# Patient Record
Sex: Female | Born: 1970 | Hispanic: No | Marital: Single | Smoking: Current every day smoker
Health system: Southern US, Community
[De-identification: ages and names within clinical notes are randomized; demographics above are authoritative.]

## PROBLEM LIST (undated history)

## (undated) DIAGNOSIS — B20 Human immunodeficiency virus [HIV] disease: Secondary | ICD-10-CM

## (undated) DIAGNOSIS — Z21 Asymptomatic human immunodeficiency virus [HIV] infection status: Secondary | ICD-10-CM

---

## 2006-02-07 HISTORY — PX: ABDOMINAL HYSTERECTOMY: SHX81

## 2013-02-20 LAB — HEPATITIS C RNA QUANTITATIVE: HEPATITIS C RNA-PCR: 70900

## 2013-04-03 LAB — HEPATIC FUNCTION PANEL
ALT: 15 U/L (ref 7–35)
AST: 19 U/L (ref 13–35)
Alkaline Phosphatase: 105 U/L (ref 25–125)
BILIRUBIN, TOTAL: 2.2 mg/dL

## 2013-04-03 LAB — BASIC METABOLIC PANEL
Creatinine: 0.8 mg/dL (ref 0.5–1.1)
Potassium: 3.7 mmol/L (ref 3.4–5.3)
SODIUM: 144 mmol/L (ref 137–147)

## 2013-04-03 LAB — T-HELPER CELLS (CD4) COUNT (NOT AT ARMC): ABSOLUTE CD4: 470

## 2013-04-03 LAB — HM PAP SMEAR: HM PAP: 2012

## 2013-04-03 LAB — CBC AND DIFFERENTIAL
Platelets: 312 10*3/uL (ref 150–399)
WBC: 8.6 10^3/mL

## 2013-04-03 LAB — HIV-1 RNA QUANT-NO REFLEX-BLD: HIV 1 RNA QUANT: 2040

## 2013-10-08 ENCOUNTER — Other Ambulatory Visit (HOSPITAL_COMMUNITY)
Admission: RE | Admit: 2013-10-08 | Discharge: 2013-10-08 | Disposition: A | Discharge status: Home / Self Care | Payer: Medicaid Other | Source: Ambulatory Visit | Attending: Internal Medicine | Admitting: Internal Medicine

## 2013-10-08 ENCOUNTER — Other Ambulatory Visit: Payer: Self-pay | Admitting: Internal Medicine

## 2013-10-08 ENCOUNTER — Ambulatory Visit: Payer: Medicaid Other

## 2013-10-08 VITALS — Wt 112.0 lb

## 2013-10-08 DIAGNOSIS — B1920 Unspecified viral hepatitis C without hepatic coma: Secondary | ICD-10-CM

## 2013-10-08 DIAGNOSIS — F17200 Nicotine dependence, unspecified, uncomplicated: Secondary | ICD-10-CM

## 2013-10-08 DIAGNOSIS — B20 Human immunodeficiency virus [HIV] disease: Secondary | ICD-10-CM

## 2013-10-08 DIAGNOSIS — Z8742 Personal history of other diseases of the female genital tract: Secondary | ICD-10-CM

## 2013-10-08 DIAGNOSIS — Z79899 Other long term (current) drug therapy: Secondary | ICD-10-CM

## 2013-10-08 DIAGNOSIS — K7460 Unspecified cirrhosis of liver: Secondary | ICD-10-CM

## 2013-10-08 DIAGNOSIS — Z8619 Personal history of other infectious and parasitic diseases: Secondary | ICD-10-CM

## 2013-10-08 DIAGNOSIS — Z113 Encounter for screening for infections with a predominantly sexual mode of transmission: Secondary | ICD-10-CM | POA: Insufficient documentation

## 2013-10-08 DIAGNOSIS — F1910 Other psychoactive substance abuse, uncomplicated: Secondary | ICD-10-CM

## 2013-10-08 DIAGNOSIS — A6000 Herpesviral infection of urogenital system, unspecified: Secondary | ICD-10-CM

## 2013-10-08 DIAGNOSIS — Z111 Encounter for screening for respiratory tuberculosis: Secondary | ICD-10-CM

## 2013-10-08 DIAGNOSIS — M549 Dorsalgia, unspecified: Secondary | ICD-10-CM

## 2013-10-08 LAB — CBC WITH DIFFERENTIAL/PLATELET
BASOS ABS: 0 10*3/uL (ref 0.0–0.1)
Basophils Relative: 0 % (ref 0–1)
Eosinophils Absolute: 0.1 10*3/uL (ref 0.0–0.7)
Eosinophils Relative: 1 % (ref 0–5)
HEMATOCRIT: 38 % (ref 36.0–46.0)
Hemoglobin: 12.5 g/dL (ref 12.0–15.0)
Lymphocytes Relative: 42 % (ref 12–46)
Lymphs Abs: 2.4 10*3/uL (ref 0.7–4.0)
MCH: 27.8 pg (ref 26.0–34.0)
MCHC: 32.9 g/dL (ref 30.0–36.0)
MCV: 84.4 fL (ref 78.0–100.0)
MONOS PCT: 13 % — AB (ref 3–12)
Monocytes Absolute: 0.7 10*3/uL (ref 0.1–1.0)
Neutro Abs: 2.5 10*3/uL (ref 1.7–7.7)
Neutrophils Relative %: 44 % (ref 43–77)
Platelets: 341 10*3/uL (ref 150–400)
RBC: 4.5 MIL/uL (ref 3.87–5.11)
RDW: 14.7 % (ref 11.5–15.5)
WBC: 5.6 10*3/uL (ref 4.0–10.5)

## 2013-10-09 LAB — DRUG SCREEN, URINE
Amphetamine Screen, Ur: NEGATIVE
BENZODIAZEPINES.: NEGATIVE
Barbiturate Quant, Ur: NEGATIVE
Cocaine Metabolites: POSITIVE — AB
Creatinine,U: 16.07 mg/dL
MARIJUANA METABOLITE: NEGATIVE
Methadone: NEGATIVE
Opiates: NEGATIVE
Phencyclidine (PCP): NEGATIVE
Propoxyphene: NEGATIVE

## 2013-10-09 LAB — COMPLETE METABOLIC PANEL WITH GFR
ALBUMIN: 3.5 g/dL (ref 3.5–5.2)
ALT: 18 U/L (ref 0–35)
AST: 31 U/L (ref 0–37)
Alkaline Phosphatase: 95 U/L (ref 39–117)
BUN: 14 mg/dL (ref 6–23)
CHLORIDE: 109 meq/L (ref 96–112)
CO2: 28 meq/L (ref 19–32)
Calcium: 8.8 mg/dL (ref 8.4–10.5)
Creat: 0.65 mg/dL (ref 0.50–1.10)
GLUCOSE: 92 mg/dL (ref 70–99)
Potassium: 4.1 mEq/L (ref 3.5–5.3)
Sodium: 143 mEq/L (ref 135–145)
TOTAL PROTEIN: 7.4 g/dL (ref 6.0–8.3)
Total Bilirubin: 0.3 mg/dL (ref 0.2–1.2)

## 2013-10-09 LAB — URINALYSIS
Bilirubin Urine: NEGATIVE
Glucose, UA: NEGATIVE mg/dL
Hgb urine dipstick: NEGATIVE
Ketones, ur: NEGATIVE mg/dL
Nitrite: NEGATIVE
PROTEIN: NEGATIVE mg/dL
Specific Gravity, Urine: 1.007 (ref 1.005–1.030)
Urobilinogen, UA: 0.2 mg/dL (ref 0.0–1.0)
pH: 7 (ref 5.0–8.0)

## 2013-10-09 LAB — T-HELPER CELL (CD4) - (RCID CLINIC ONLY)
CD4 % Helper T Cell: 12 % — ABNORMAL LOW (ref 33–55)
CD4 T Cell Abs: 270 /uL — ABNORMAL LOW (ref 400–2700)

## 2013-10-09 LAB — HEPATITIS B SURFACE ANTIGEN: Hepatitis B Surface Ag: NEGATIVE

## 2013-10-09 LAB — LIPID PANEL
Cholesterol: 184 mg/dL (ref 0–200)
HDL: 72 mg/dL (ref >39)
LDL CALC: 91 mg/dL (ref 0–99)
Total CHOL/HDL Ratio: 2.6 Ratio
Triglycerides: 104 mg/dL (ref <150)
VLDL: 21 mg/dL (ref 0–40)

## 2013-10-09 LAB — HEPATITIS A ANTIBODY, TOTAL: HEP A TOTAL AB: REACTIVE — AB

## 2013-10-09 LAB — HEPATITIS C ANTIBODY: HCV Ab: REACTIVE — AB

## 2013-10-09 LAB — HEPATITIS B CORE ANTIBODY, TOTAL: Hep B Core Total Ab: NONREACTIVE

## 2013-10-09 LAB — RPR

## 2013-10-09 LAB — HEPATITIS B SURFACE ANTIBODY,QUALITATIVE: HEP B S AB: NEGATIVE

## 2013-10-10 LAB — QUANTIFERON TB GOLD ASSAY (BLOOD)
INTERFERON GAMMA RELEASE ASSAY: NEGATIVE
Mitogen value: 1.21 IU/mL
QUANTIFERON NIL VALUE: 0.06 [IU]/mL
QUANTIFERON TB AG MINUS NIL: 0 [IU]/mL
TB Ag value: 0.06 IU/mL

## 2013-10-10 LAB — HIV-1 RNA ULTRAQUANT REFLEX TO GENTYP+
HIV 1 RNA Quant: 294001 copies/mL — ABNORMAL HIGH (ref <20)
HIV-1 RNA QUANT, LOG: 5.47 {Log} — AB (ref <1.30)

## 2013-10-11 DIAGNOSIS — Z8619 Personal history of other infectious and parasitic diseases: Secondary | ICD-10-CM | POA: Insufficient documentation

## 2013-10-11 DIAGNOSIS — F17200 Nicotine dependence, unspecified, uncomplicated: Secondary | ICD-10-CM | POA: Insufficient documentation

## 2013-10-11 DIAGNOSIS — A6000 Herpesviral infection of urogenital system, unspecified: Secondary | ICD-10-CM | POA: Insufficient documentation

## 2013-10-11 DIAGNOSIS — B1920 Unspecified viral hepatitis C without hepatic coma: Secondary | ICD-10-CM | POA: Insufficient documentation

## 2013-10-11 DIAGNOSIS — Z8742 Personal history of other diseases of the female genital tract: Secondary | ICD-10-CM | POA: Insufficient documentation

## 2013-10-11 NOTE — Progress Notes (Signed)
Patient is here today by urgent referral( high risk)  from [facility] Department.  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  She has issues with transportation  and our [employer] from [employer] has agreed to open her case and provide transportation.   Breshae states her major concern today is getting assistance with polysubstance abuse.  She is a daily "crack user" and has not had a "hit" today but says she is currently in need of a hit.   She is very anxious and continually ask how long will this take?  When asked about her crack use she gives history of using sex as a means of funding her habit and "getting high for a long time/many years/I don't know". Currently she is not able to focus on intake questions and answers "I  don't know" to each. I will obtain her medical records even though she is not able to tell me the name of the clinic she attend in [place]. She is not able to name medications but picked out three on the medication chart.  Reyataz, Truvada and Viracept.  I doubt this regimen is correct and will try to verify once medications are obtained.   As for now I feel she needs to see a Camera operator for assistance with crack use . New 042 labs done today, patient refused vaccines but may reconsider at return visit.   She is currently living with her Sister who stated the only way she could continue to stay was to get  Help with her problem.  I feel Drake is motivated to get assistance at this time.

## 2013-10-12 LAB — HLA B*5701: HLA-B*5701 w/rflx HLA-B High: NEGATIVE

## 2013-10-15 LAB — HEPATITIS C RNA QUANTITATIVE
HCV QUANT LOG: 4.95 {Log} — AB (ref <1.18)
HCV Quantitative: 89053 IU/mL — ABNORMAL HIGH (ref <15)

## 2013-10-22 ENCOUNTER — Encounter: Payer: Self-pay | Admitting: Internal Medicine

## 2013-10-22 ENCOUNTER — Ambulatory Visit: Payer: Medicaid Other

## 2013-10-22 ENCOUNTER — Ambulatory Visit (INDEPENDENT_AMBULATORY_CARE_PROVIDER_SITE_OTHER): Payer: Medicaid Other | Admitting: Internal Medicine

## 2013-10-22 VITALS — BP 111/75 | HR 156 | Temp 97.3°F | Wt 122.0 lb

## 2013-10-22 DIAGNOSIS — Z23 Encounter for immunization: Secondary | ICD-10-CM

## 2013-10-22 DIAGNOSIS — B009 Herpesviral infection, unspecified: Secondary | ICD-10-CM

## 2013-10-22 DIAGNOSIS — B373 Candidiasis of vulva and vagina: Secondary | ICD-10-CM

## 2013-10-22 DIAGNOSIS — Z21 Asymptomatic human immunodeficiency virus [HIV] infection status: Secondary | ICD-10-CM

## 2013-10-22 DIAGNOSIS — B3731 Acute candidiasis of vulva and vagina: Secondary | ICD-10-CM

## 2013-10-22 LAB — HIV-1 GENOTYPR PLUS

## 2013-10-22 MED ORDER — DARUNAVIR-COBICISTAT 800-150 MG PO TABS: 1.0000 | ORAL_TABLET | Freq: Every day | ORAL | Status: DC

## 2013-10-22 MED ORDER — FLUCONAZOLE 150 MG PO TABS: 150.0000 mg | ORAL_TABLET | Freq: Every day | ORAL | Status: DC

## 2013-10-22 MED ORDER — VALACYCLOVIR HCL 1 G PO TABS: 1000.0000 mg | ORAL_TABLET | Freq: Every day | ORAL | Status: DC

## 2013-10-22 MED ORDER — EMTRICITABINE-TENOFOVIR DF 200-300 MG PO TABS: 1.0000 | ORAL_TABLET | Freq: Every day | ORAL | Status: DC

## 2013-10-22 NOTE — Progress Notes (Signed)
Patient ID: Vanessa Cross, female   DOB: [birth year], 43 y.o.   MRN: [id number]       Patient ID: Vanessa Cross, female   DOB: [birth year], 43 y.o.   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 Cd 4 count 270/VL 300,000. Off of ART in the last 4 months. Previously on truvada/atazanavir-ritonavir. Dx in 2000. Would like to get back onto treatment. No recent illnesses  Outpatient Encounter Prescriptions as of 10/22/2013  Medication Sig  . acyclovir (ZOVIRAX) 200 MG capsule Take 200 mg by mouth 2 (two) times daily.  Marland Kitchen atazanavir (REYATAZ) 100 MG capsule Take 100 mg by mouth daily with breakfast.  . benztropine (COGENTIN) 1 MG tablet Take 1 mg by mouth 2 (two) times daily.  . carbamazepine (TEGRETOL) 200 MG tablet Take 200 mg by mouth 2 (two) times daily.  Marland Kitchen emtricitabine-tenofovir (TRUVADA) 200-300 MG per tablet Take 1 tablet by mouth daily.  . haloperidol (HALDOL) 5 MG tablet Take 5 mg by mouth 2 (two) times daily.  Marland Kitchen nelfinavir (VIRACEPT) 250 MG tablet Take 750 mg by mouth 3 (three) times daily with meals.  . ritonavir (NORVIR) 100 MG capsule Take 100 mg by mouth daily with breakfast.     Patient Active Problem List   Diagnosis Date Noted  . 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 Tobacco use disorder 10/11/2013  . 2012 10/11/2013  . Genital herpes 10/11/2013  . History of trichomoniasis 10/11/2013  . History of gonorrhea 10/11/2013     Health Maintenance Due  Topic Date Due  . Tetanus/tdap  02/06/1988  . Influenza Vaccine  09/07/2013     Review of Systems  Physical Exam   BP 111/75  Pulse 156  Temp(Src) 97.3 F (36.3 C) (Oral)  Wt 122 lb (55.339 kg) Physical Exam  Constitutional:  oriented to person, place, and time. appears well-developed and well-nourished. No distress.  HENT:  Mouth/Throat: Oropharynx is clear and moist. No oropharyngeal exudate.  Cardiovascular: Normal rate, regular rhythm and  normal heart sounds. Exam reveals no gallop and no friction rub.  No murmur heard.  Pulmonary/Chest: Effort normal and breath sounds normal. No respiratory distress.  has no wheezes.  Abdominal: Soft. Bowel sounds are normal.  exhibits no distension. There is no tenderness.  Lymphadenopathy: no cervical adenopathy.  Neurological: alert and oriented to person, place, and time.  Skin: Skin is warm and dry. No rash noted. No erythema.  Psychiatric: a normal mood and affect.  behavior is normal.   Lab Results  Component Value Date   CD4TCELL 12* 10/08/2013   Lab Results  Component Value Date   CD4TABS 270* 10/08/2013   Lab Results  Component Value Date   HIV1RNAQUANT 294001* 10/08/2013   Lab Results  Component Value Date   HEPBSAB NEG 10/08/2013   No results found for this basename: RPR    CBC Lab Results  Component Value Date   WBC 5.6 10/08/2013   RBC 4.50 10/08/2013   HGB 12.5 10/08/2013   HCT 38.0 10/08/2013   PLT 341 10/08/2013   MCV 84.4 10/08/2013   MCH 27.8 10/08/2013   MCHC 32.9 10/08/2013   RDW 14.7 10/08/2013   LYMPHSABS 2.4 10/08/2013   MONOABS 0.7 10/08/2013   EOSABS 0.1 10/08/2013   BASOSABS 0.0 10/08/2013   BMET Lab Results  Component Value Date   NA 143 10/08/2013   K 4.1 10/08/2013   CL 109 10/08/2013   CO2  28 10/08/2013   GLUCOSE 92 10/08/2013   BUN 14 10/08/2013   CREATININE 0.65 10/08/2013   CALCIUM 8.8 10/08/2013   GFRNONAA >89 10/08/2013   GFRAA >89 10/08/2013     Assessment and Plan  hiv =  Will give truvada 1 tab daily, and prezcobix 1 tab daily. Pill box.  Ascus hx = will need pap  Presumed vaginal candidiasis = will give fluconazole x 1  Hx of herpes = will give valtrex  Health maintenance = will give flu, pneumococcal, and hep B #1  Bipolar = continue on current regimen  HCV = at next visit, will start work up for treatment

## 2013-11-19 ENCOUNTER — Ambulatory Visit: Payer: Medicaid Other | Admitting: Internal Medicine

## 2013-11-19 ENCOUNTER — Ambulatory Visit: Payer: Medicaid Other

## 2013-11-27 ENCOUNTER — Other Ambulatory Visit: Payer: Self-pay | Admitting: *Deleted

## 2013-11-27 DIAGNOSIS — Z21 Asymptomatic human immunodeficiency virus [HIV] infection status: Secondary | ICD-10-CM

## 2013-11-27 MED ORDER — DARUNAVIR-COBICISTAT 800-150 MG PO TABS: 1.0000 | ORAL_TABLET | Freq: Every day | ORAL | Status: DC

## 2013-11-27 MED ORDER — EMTRICITABINE-TENOFOVIR DF 200-300 MG PO TABS: 1.0000 | ORAL_TABLET | Freq: Every day | ORAL | Status: DC

## 2013-12-02 ENCOUNTER — Other Ambulatory Visit: Payer: Self-pay | Admitting: *Deleted

## 2013-12-02 DIAGNOSIS — Z21 Asymptomatic human immunodeficiency virus [HIV] infection status: Secondary | ICD-10-CM

## 2013-12-02 MED ORDER — DARUNAVIR-COBICISTAT 800-150 MG PO TABS: 1.0000 | ORAL_TABLET | Freq: Every day | ORAL | Status: DC

## 2013-12-02 MED ORDER — EMTRICITABINE-TENOFOVIR DF 200-300 MG PO TABS: 1.0000 | ORAL_TABLET | Freq: Every day | ORAL | Status: DC

## 2013-12-18 ENCOUNTER — Ambulatory Visit: Payer: Medicaid Other | Admitting: Internal Medicine

## 2013-12-18 ENCOUNTER — Ambulatory Visit: Payer: Medicaid Other

## 2013-12-19 ENCOUNTER — Ambulatory Visit: Payer: Medicaid Other

## 2013-12-19 ENCOUNTER — Other Ambulatory Visit: Payer: Self-pay | Admitting: Internal Medicine

## 2013-12-19 ENCOUNTER — Ambulatory Visit (INDEPENDENT_AMBULATORY_CARE_PROVIDER_SITE_OTHER): Payer: Medicaid Other | Admitting: Internal Medicine

## 2013-12-19 ENCOUNTER — Encounter: Payer: Self-pay | Admitting: Internal Medicine

## 2013-12-19 VITALS — BP 102/70 | HR 80 | Temp 98.0°F | Wt 121.0 lb

## 2013-12-19 DIAGNOSIS — B182 Chronic viral hepatitis C: Secondary | ICD-10-CM

## 2013-12-19 DIAGNOSIS — Z21 Asymptomatic human immunodeficiency virus [HIV] infection status: Secondary | ICD-10-CM

## 2013-12-19 DIAGNOSIS — B3731 Acute candidiasis of vulva and vagina: Secondary | ICD-10-CM

## 2013-12-19 DIAGNOSIS — Z23 Encounter for immunization: Secondary | ICD-10-CM

## 2013-12-19 DIAGNOSIS — R300 Dysuria: Secondary | ICD-10-CM

## 2013-12-19 DIAGNOSIS — Z113 Encounter for screening for infections with a predominantly sexual mode of transmission: Secondary | ICD-10-CM

## 2013-12-19 DIAGNOSIS — B373 Candidiasis of vulva and vagina: Secondary | ICD-10-CM

## 2013-12-19 LAB — CBC WITH DIFFERENTIAL/PLATELET
BASOS PCT: 0 % (ref 0–1)
Basophils Absolute: 0 10*3/uL (ref 0.0–0.1)
Eosinophils Absolute: 0.8 10*3/uL — ABNORMAL HIGH (ref 0.0–0.7)
Eosinophils Relative: 10 % — ABNORMAL HIGH (ref 0–5)
HCT: 37.6 % (ref 36.0–46.0)
HEMOGLOBIN: 12.3 g/dL (ref 12.0–15.0)
LYMPHS ABS: 3 10*3/uL (ref 0.7–4.0)
Lymphocytes Relative: 38 % (ref 12–46)
MCH: 27.9 pg (ref 26.0–34.0)
MCHC: 32.7 g/dL (ref 30.0–36.0)
MCV: 85.3 fL (ref 78.0–100.0)
MONOS PCT: 8 % (ref 3–12)
Monocytes Absolute: 0.6 10*3/uL (ref 0.1–1.0)
NEUTROS ABS: 3.5 10*3/uL (ref 1.7–7.7)
NEUTROS PCT: 44 % (ref 43–77)
PLATELETS: 252 10*3/uL (ref 150–400)
RBC: 4.41 MIL/uL (ref 3.87–5.11)
RDW: 16 % — ABNORMAL HIGH (ref 11.5–15.5)
WBC: 7.9 10*3/uL (ref 4.0–10.5)

## 2013-12-19 LAB — COMPLETE METABOLIC PANEL WITH GFR
ALK PHOS: 89 U/L (ref 39–117)
ALT: 17 U/L (ref 0–35)
AST: 23 U/L (ref 0–37)
Albumin: 3.7 g/dL (ref 3.5–5.2)
BILIRUBIN TOTAL: 0.4 mg/dL (ref 0.2–1.2)
BUN: 12 mg/dL (ref 6–23)
CO2: 25 meq/L (ref 19–32)
Calcium: 9.2 mg/dL (ref 8.4–10.5)
Chloride: 105 mEq/L (ref 96–112)
Creat: 0.92 mg/dL (ref 0.50–1.10)
GFR, Est African American: 88 mL/min
GFR, Est Non African American: 76 mL/min
Glucose, Bld: 98 mg/dL (ref 70–99)
POTASSIUM: 4.6 meq/L (ref 3.5–5.3)
SODIUM: 138 meq/L (ref 135–145)
TOTAL PROTEIN: 7.4 g/dL (ref 6.0–8.3)

## 2013-12-19 MED ORDER — FLUCONAZOLE 150 MG PO TABS: 150.0000 mg | ORAL_TABLET | Freq: Every day | ORAL | Status: DC

## 2013-12-19 NOTE — Progress Notes (Signed)
Subjective:    Patient ID: Vanessa Cross, female    DOB: 07-16-1970, 43 y.o.   MRN: [id number]  HPI  43yo F with HIV disease-HCV co infection, Bipolar disease, CD 4 count of 270/VL 294,001 started on truvada/prezcobix since last visit, unclear when she did it exactly since she was briefly incarcerated. She states that she has taken them consistently since being released. Also noticing vaginal burning, discharge concern for yeast infection. No fevers, no foul odor. No recent sex but again can't give time frame. She is seeking drug counseling for hx of cocaine use. Wants to go into treatment program in January  Also is not currently on any medications for bipolar disease and needs to   Also complains of blurry vision. Has not had eye exam in awhile she reports  Current Outpatient Prescriptions on File Prior to Visit  Medication Sig Dispense Refill  . darunavir-cobicistat (PREZCOBIX) 800-150 MG per tablet Take 1 tablet by mouth daily. Swallow whole. Do NOT crush, break or chew tablets. Take with food. 30 tablet 5  . emtricitabine-tenofovir (TRUVADA) 200-300 MG per tablet Take 1 tablet by mouth daily. 30 tablet 5  . acyclovir (ZOVIRAX) 200 MG capsule Take 200 mg by mouth 2 (two) times daily.    . benztropine (COGENTIN) 1 MG tablet Take 1 mg by mouth 2 (two) times daily.    . carbamazepine (TEGRETOL) 200 MG tablet Take 200 mg by mouth 2 (two) times daily.    . fluconazole (DIFLUCAN) 150 MG tablet Take 1 tablet (150 mg total) by mouth daily. 1 tablet 0  . haloperidol (HALDOL) 5 MG tablet Take 5 mg by mouth 2 (two) times daily.    . valACYclovir (VALTREX) 1000 MG tablet Take 1 tablet (1,000 mg total) by mouth daily. 30 tablet 11   No current facility-administered medications on file prior to visit.   Active Ambulatory Problems    Diagnosis Date Noted  . 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 Tobacco use disorder 10/11/2013  . 2012 10/11/2013  . Genital herpes  10/11/2013  . History of trichomoniasis 10/11/2013  . History of gonorrhea 10/11/2013   Resolved Ambulatory Problems    Diagnosis Date Noted  . No Resolved Ambulatory Problems   No Additional Past Medical History     Review of Systems Review of Systems  Constitutional: Negative for fever, chills, diaphoresis, activity change, appetite change, fatigue and unexpected weight change.  HENT: Negative for congestion, sore throat, rhinorrhea, sneezing, trouble swallowing and sinus pressure.  Eyes: Negative for photophobia and visual disturbance.  Respiratory: Negative for cough, chest tightness, shortness of breath, wheezing and stridor.  Cardiovascular: Negative for chest pain, palpitations and leg swelling.  Gastrointestinal: Negative for nausea, vomiting, abdominal pain, diarrhea, constipation, blood in stool, abdominal distention and anal bleeding.  Genitourinary: positive for dysuriaa, but negative hematuria, flank pain and difficulty urinating.  Musculoskeletal: Negative for myalgias, back pain, joint swelling, arthralgias and gait problem.  Skin: Negative for color change, pallor, rash and wound.  Neurological: Negative for dizziness, tremors, weakness and light-headedness.  Hematological: Negative for adenopathy. Does not bruise/bleed easily.  Psychiatric/Behavioral: Negative for behavioral problems, confusion, sleep disturbance, dysphoric mood, decreased concentration and agitation.       Objective:   Physical Exam BP 102/70 mmHg  Pulse 80  Temp(Src) 98 F (36.7 C) (Oral)  Wt 121 lb (54.885 kg) Physical Exam  Constitutional:  oriented to person, place, and time. appears well-developed and well-nourished. No distress.  HENT: edentulous  Mouth/Throat: Oropharynx is clear and moist. No oropharyngeal exudate.  Cardiovascular: Normal rate, regular rhythm and normal heart sounds. Exam reveals no gallop and no friction rub.  No murmur heard.  Pulmonary/Chest: Effort normal and  breath sounds normal. No respiratory distress.  has no wheezes.  Abdominal: Soft. Bowel sounds are normal.  exhibits no distension. There is no tenderness.  Lymphadenopathy: no cervical adenopathy.  Neurological: alert and oriented to person, place, and time.  Skin: Skin is warm and dry. No rash noted. No erythema.  Psychiatric: a normal mood and affect. His behavior is normal.         Assessment & Plan:  hiv = will check labs to see how she is doing with new regimen  Dysuria = concern for uti, vs. Sti. Will empirically treat for yeast  Low literacy = will need help with bridge counseling  hcv = will check genotype and see how serious she is on getting treatment for past cocaine use  Sex work = encouraged not to do it and always use condoms  Bipolar = [facility]  Health maintenance = getting hep B #2.

## 2013-12-19 NOTE — Addendum Note (Signed)
Addended bySteva Colder: Christerpher Clos on: 12/19/2013 02:09 PM   Modules accepted: Orders

## 2013-12-20 LAB — T-HELPER CELL (CD4) - (RCID CLINIC ONLY)
CD4 % Helper T Cell: 18 % — ABNORMAL LOW (ref 33–55)
CD4 T CELL ABS: 610 /uL (ref 400–2700)

## 2013-12-20 LAB — RPR

## 2013-12-20 LAB — HIV-1 RNA QUANT-NO REFLEX-BLD
HIV 1 RNA Quant: 3059 copies/mL — ABNORMAL HIGH (ref <20)
HIV-1 RNA Quant, Log: 3.49 {Log} — ABNORMAL HIGH (ref <1.30)

## 2013-12-20 LAB — HEPATITIS C RNA QUANTITATIVE
HCV Quantitative Log: 5.28 {Log} — ABNORMAL HIGH (ref <1.18)
HCV Quantitative: 190954 IU/mL — ABNORMAL HIGH (ref <15)

## 2013-12-20 NOTE — Addendum Note (Signed)
Addended by: Mariea ClontsGREEN, Buell Parcel D on: 12/20/2013 09:38 AM   Modules accepted: Orders

## 2013-12-24 LAB — HEPATITIS C GENOTYPE

## 2014-01-07 ENCOUNTER — Ambulatory Visit: Payer: Medicaid Other

## 2014-02-19 ENCOUNTER — Ambulatory Visit: Payer: Medicaid Other

## 2014-03-11 ENCOUNTER — Other Ambulatory Visit: Payer: Medicaid Other

## 2014-03-11 DIAGNOSIS — B20 Human immunodeficiency virus [HIV] disease: Secondary | ICD-10-CM

## 2014-03-11 LAB — CBC WITH DIFFERENTIAL/PLATELET
BASOS ABS: 0.1 10*3/uL (ref 0.0–0.1)
Basophils Relative: 1 % (ref 0–1)
EOS ABS: 0.2 10*3/uL (ref 0.0–0.7)
Eosinophils Relative: 3 % (ref 0–5)
HEMATOCRIT: 38.8 % (ref 36.0–46.0)
HEMOGLOBIN: 12.6 g/dL (ref 12.0–15.0)
LYMPHS ABS: 2.3 10*3/uL (ref 0.7–4.0)
LYMPHS PCT: 39 % (ref 12–46)
MCH: 28 pg (ref 26.0–34.0)
MCHC: 32.5 g/dL (ref 30.0–36.0)
MCV: 86.2 fL (ref 78.0–100.0)
MPV: 10 fL (ref 8.6–12.4)
Monocytes Absolute: 0.6 10*3/uL (ref 0.1–1.0)
Monocytes Relative: 11 % (ref 3–12)
NEUTROS ABS: 2.7 10*3/uL (ref 1.7–7.7)
NEUTROS PCT: 46 % (ref 43–77)
Platelets: 226 10*3/uL (ref 150–400)
RBC: 4.5 MIL/uL (ref 3.87–5.11)
RDW: 13.5 % (ref 11.5–15.5)
WBC: 5.8 10*3/uL (ref 4.0–10.5)

## 2014-03-11 LAB — COMPLETE METABOLIC PANEL WITH GFR
ALT: 15 U/L (ref 0–35)
AST: 22 U/L (ref 0–37)
Albumin: 3.5 g/dL (ref 3.5–5.2)
Alkaline Phosphatase: 104 U/L (ref 39–117)
BUN: 15 mg/dL (ref 6–23)
CHLORIDE: 107 meq/L (ref 96–112)
CO2: 26 meq/L (ref 19–32)
Calcium: 8.7 mg/dL (ref 8.4–10.5)
Creat: 0.79 mg/dL (ref 0.50–1.10)
GLUCOSE: 119 mg/dL — AB (ref 70–99)
POTASSIUM: 4 meq/L (ref 3.5–5.3)
SODIUM: 139 meq/L (ref 135–145)
Total Bilirubin: 0.5 mg/dL (ref 0.2–1.2)
Total Protein: 7.2 g/dL (ref 6.0–8.3)

## 2014-03-12 LAB — T-HELPER CELL (CD4) - (RCID CLINIC ONLY)
CD4 % Helper T Cell: 17 % — ABNORMAL LOW (ref 33–55)
CD4 T Cell Abs: 430 /uL (ref 400–2700)

## 2014-03-12 LAB — HIV-1 RNA QUANT-NO REFLEX-BLD
HIV 1 RNA Quant: 100831 copies/mL — ABNORMAL HIGH (ref <20)
HIV-1 RNA Quant, Log: 5 {Log} — ABNORMAL HIGH (ref <1.30)

## 2014-03-25 ENCOUNTER — Ambulatory Visit: Payer: Medicaid Other | Admitting: Internal Medicine

## 2014-05-15 ENCOUNTER — Ambulatory Visit: Payer: Medicaid Other | Admitting: Internal Medicine

## 2014-11-20 ENCOUNTER — Other Ambulatory Visit: Payer: Medicaid Other

## 2014-11-20 DIAGNOSIS — B20 Human immunodeficiency virus [HIV] disease: Secondary | ICD-10-CM

## 2014-11-20 DIAGNOSIS — Z113 Encounter for screening for infections with a predominantly sexual mode of transmission: Secondary | ICD-10-CM

## 2014-11-20 DIAGNOSIS — Z79899 Other long term (current) drug therapy: Secondary | ICD-10-CM

## 2014-11-20 LAB — COMPLETE METABOLIC PANEL WITH GFR
ALT: 15 U/L (ref 6–29)
AST: 27 U/L (ref 10–35)
Albumin: 3.5 g/dL — ABNORMAL LOW (ref 3.6–5.1)
Alkaline Phosphatase: 72 U/L (ref 33–115)
BUN: 17 mg/dL (ref 7–25)
CHLORIDE: 107 mmol/L (ref 98–110)
CO2: 25 mmol/L (ref 20–31)
CREATININE: 0.93 mg/dL (ref 0.50–1.10)
Calcium: 9.3 mg/dL (ref 8.6–10.2)
GFR, EST AFRICAN AMERICAN: 86 mL/min (ref >=60)
GFR, EST NON AFRICAN AMERICAN: 74 mL/min (ref >=60)
Glucose, Bld: 79 mg/dL (ref 65–99)
Potassium: 4 mmol/L (ref 3.5–5.3)
Sodium: 142 mmol/L (ref 135–146)
Total Bilirubin: 0.5 mg/dL (ref 0.2–1.2)
Total Protein: 7.2 g/dL (ref 6.1–8.1)

## 2014-11-20 LAB — CBC WITH DIFFERENTIAL/PLATELET
BASOS ABS: 0 10*3/uL (ref 0.0–0.1)
Basophils Relative: 0 % (ref 0–1)
EOS ABS: 0.1 10*3/uL (ref 0.0–0.7)
Eosinophils Relative: 2 % (ref 0–5)
HCT: 36.6 % (ref 36.0–46.0)
Hemoglobin: 12.6 g/dL (ref 12.0–15.0)
LYMPHS ABS: 1.9 10*3/uL (ref 0.7–4.0)
LYMPHS PCT: 37 % (ref 12–46)
MCH: 28.1 pg (ref 26.0–34.0)
MCHC: 34.4 g/dL (ref 30.0–36.0)
MCV: 81.5 fL (ref 78.0–100.0)
MONOS PCT: 14 % — AB (ref 3–12)
MPV: 9.8 fL (ref 8.6–12.4)
Monocytes Absolute: 0.7 10*3/uL (ref 0.1–1.0)
NEUTROS ABS: 2.4 10*3/uL (ref 1.7–7.7)
NEUTROS PCT: 47 % (ref 43–77)
PLATELETS: 273 10*3/uL (ref 150–400)
RBC: 4.49 MIL/uL (ref 3.87–5.11)
RDW: 13.9 % (ref 11.5–15.5)
WBC: 5.2 10*3/uL (ref 4.0–10.5)

## 2014-11-20 LAB — LIPID PANEL
CHOLESTEROL: 160 mg/dL (ref 125–200)
HDL: 72 mg/dL (ref >=46)
LDL Cholesterol: 77 mg/dL (ref <130)
Total CHOL/HDL Ratio: 2.2 Ratio (ref <=5.0)
Triglycerides: 57 mg/dL (ref <150)
VLDL: 11 mg/dL (ref <30)

## 2014-11-20 LAB — RPR

## 2014-11-21 LAB — T-HELPER CELL (CD4) - (RCID CLINIC ONLY)
CD4 % Helper T Cell: 16 % — ABNORMAL LOW (ref 33–55)
CD4 T Cell Abs: 310 /uL — ABNORMAL LOW (ref 400–2700)

## 2014-11-21 LAB — HIV-1 RNA QUANT-NO REFLEX-BLD
HIV 1 RNA Quant: 230241 copies/mL — ABNORMAL HIGH (ref <20)
HIV-1 RNA Quant, Log: 5.36 {Log} — ABNORMAL HIGH (ref <1.30)

## 2014-12-04 ENCOUNTER — Ambulatory Visit: Payer: Medicaid Other | Admitting: Internal Medicine

## 2015-03-19 ENCOUNTER — Ambulatory Visit: Payer: Medicaid Other | Admitting: Internal Medicine

## 2016-06-10 ENCOUNTER — Emergency Department (HOSPITAL_COMMUNITY)
Admission: EM | Admit: 2016-06-10 | Discharge: 2016-06-10 | Disposition: A | Discharge status: Home / Self Care | Payer: MEDICAID | Attending: Emergency Medicine | Admitting: Emergency Medicine

## 2016-06-10 ENCOUNTER — Encounter (HOSPITAL_COMMUNITY): Payer: Self-pay | Admitting: Emergency Medicine

## 2016-06-10 DIAGNOSIS — F1423 Cocaine dependence with withdrawal: Secondary | ICD-10-CM | POA: Insufficient documentation

## 2016-06-10 DIAGNOSIS — F1210 Cannabis abuse, uncomplicated: Secondary | ICD-10-CM | POA: Insufficient documentation

## 2016-06-10 DIAGNOSIS — F17210 Nicotine dependence, cigarettes, uncomplicated: Secondary | ICD-10-CM | POA: Insufficient documentation

## 2016-06-10 DIAGNOSIS — F1910 Other psychoactive substance abuse, uncomplicated: Secondary | ICD-10-CM

## 2016-06-10 DIAGNOSIS — Z79899 Other long term (current) drug therapy: Secondary | ICD-10-CM | POA: Diagnosis not present

## 2016-06-10 HISTORY — DX: Human immunodeficiency virus (HIV) disease: B20

## 2016-06-10 HISTORY — DX: Asymptomatic human immunodeficiency virus (hiv) infection status: Z21

## 2016-06-10 LAB — CBC WITH DIFFERENTIAL/PLATELET
Basophils Absolute: 0 10*3/uL (ref 0.0–0.1)
Basophils Relative: 0 %
EOS PCT: 1 %
Eosinophils Absolute: 0.1 10*3/uL (ref 0.0–0.7)
HCT: 37.4 % (ref 36.0–46.0)
Hemoglobin: 12.6 g/dL (ref 12.0–15.0)
LYMPHS ABS: 3.1 10*3/uL (ref 0.7–4.0)
LYMPHS PCT: 48 %
MCH: 28.3 pg (ref 26.0–34.0)
MCHC: 33.7 g/dL (ref 30.0–36.0)
MCV: 84 fL (ref 78.0–100.0)
MONO ABS: 0.7 10*3/uL (ref 0.1–1.0)
MONOS PCT: 11 %
Neutro Abs: 2.6 10*3/uL (ref 1.7–7.7)
Neutrophils Relative %: 40 %
PLATELETS: 191 10*3/uL (ref 150–400)
RBC: 4.45 MIL/uL (ref 3.87–5.11)
RDW: 13.1 % (ref 11.5–15.5)
WBC: 6.4 10*3/uL (ref 4.0–10.5)

## 2016-06-10 LAB — COMPREHENSIVE METABOLIC PANEL
ALT: 17 U/L (ref 14–54)
AST: 30 U/L (ref 15–41)
Albumin: 3.4 g/dL — ABNORMAL LOW (ref 3.5–5.0)
Alkaline Phosphatase: 89 U/L (ref 38–126)
Anion gap: 8 (ref 5–15)
BILIRUBIN TOTAL: 0.4 mg/dL (ref 0.3–1.2)
BUN: 18 mg/dL (ref 6–20)
CO2: 21 mmol/L — ABNORMAL LOW (ref 22–32)
CREATININE: 0.96 mg/dL (ref 0.44–1.00)
Calcium: 8.6 mg/dL — ABNORMAL LOW (ref 8.9–10.3)
Chloride: 110 mmol/L (ref 101–111)
Glucose, Bld: 124 mg/dL — ABNORMAL HIGH (ref 65–99)
Potassium: 3.7 mmol/L (ref 3.5–5.1)
Sodium: 139 mmol/L (ref 135–145)
TOTAL PROTEIN: 7.3 g/dL (ref 6.5–8.1)

## 2016-06-10 LAB — RAPID URINE DRUG SCREEN, HOSP PERFORMED
Amphetamines: NOT DETECTED
BARBITURATES: NOT DETECTED
BENZODIAZEPINES: NOT DETECTED
COCAINE: POSITIVE — AB
OPIATES: NOT DETECTED
TETRAHYDROCANNABINOL: POSITIVE — AB

## 2016-06-10 LAB — ETHANOL: Alcohol, Ethyl (B): <5 mg/dL (ref <5)

## 2016-06-10 MED ORDER — CHLORDIAZEPOXIDE HCL 25 MG PO CAPS: ORAL_CAPSULE | ORAL | 0 refills | Status: DC

## 2016-06-10 NOTE — ED Notes (Signed)
Calling social worker for pt she told PA that she has not had her HIV meds x 1 year

## 2016-06-10 NOTE — ED Provider Notes (Signed)
[facility]-EMERGENCY DEPT Provider Note   CSN: [id number] Arrival date & time: 06/10/16  1052  By signing my name below, I, Rosario Adie, attest that this documentation has been prepared under the direction and in the presence of [facility], PA-C.  Electronically Signed: Rosario Adie, ED Scribe. 06/10/16. 11:39 AM.  History   Chief Complaint Chief Complaint  Patient presents with  . wants detox   The history is provided by the patient. No language interpreter was used.    HPI Comments: Vanessa Cross is a 46 y.o. female who presents to the Emergency Department requesting detoxification from crack-cocaine, beer, and marijuana. Pt reports that she typically drinks 0.5 cases a day of beer, and she last drank last night. Pt also last used crack-cocaine last night approximately 8 hours ago. She has previously undergone detox last year for the same in a different city with some success; however, after moving to the area she began using again. When pressed about why she wants to pursue detox, she states that she is "just tired of using". She reports that she does currently have some pain in her lower back, but is otherwise asymptomatic. Pt currently lives with her sister who does currently smoke crack-cocaine as well. Pt also states that has been off of her prescribed HIV medications over the past month. She denies SI/HI, , fever, shortness of breath, chest pain, cough, anorexia, abdominal pain, nausea, vomiting, diarrhea, urgency, frequency, hematuria, dysuria, difficulty urinating, or any other associated symptoms.   Past Medical History:  Diagnosis Date  . HIV (human immunodeficiency virus infection) [facility])     Patient Active Problem List   Diagnosis Date Noted  . 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 Tobacco use disorder 10/11/2013  . 2012 10/11/2013  . Genital herpes 10/11/2013  . History of trichomoniasis 10/11/2013  . History of gonorrhea 10/11/2013    Past Surgical History:  Procedure Laterality Date  . ABDOMINAL HYSTERECTOMY  2008   OB History    No data available     Home Medications    Prior to Admission medications   Medication Sig Start Date End Date Taking? Authorizing Provider  benztropine (COGENTIN) 1 MG tablet Take 1 mg by mouth 2 (two) times daily.    Historical Provider, MD  carbamazepine (TEGRETOL) 200 MG tablet Take 200 mg by mouth 2 (two) times daily.    Historical Provider, MD  chlordiazePOXIDE (LIBRIUM) 25 MG capsule 50mg  PO TID x 1D, then 25-50mg  PO BID X 1D, then 25-50mg  PO QD X 1D 06/10/16   Trixie Dredge, PA-C  darunavir-cobicistat (PREZCOBIX) 800-150 MG per tablet Take 1 tablet by mouth daily. Swallow whole. Do NOT crush, break or chew tablets. Take with food. 12/02/13   Gardiner Barefoot, MD  emtricitabine-tenofovir (TRUVADA) 200-300 MG per tablet Take 1 tablet by mouth daily. 12/02/13   Gardiner Barefoot, MD  fluconazole (DIFLUCAN) 150 MG tablet Take 1 tablet (150 mg total) by mouth daily. 12/19/13   Judyann Munson, MD  haloperidol (HALDOL) 5 MG tablet Take 5 mg by mouth 2 (two) times daily.    Historical Provider, MD  valACYclovir (VALTREX) 1000 MG tablet Take 1 tablet (1,000 mg total) by mouth daily. 10/22/13   Judyann Munson, MD   Family History History reviewed. No pertinent family history.  Social History Social History  Substance Use Topics  . Smoking status: Current Every Day Smoker    Packs/day: 0.50    Years: 20.00    Types:  Cigarettes  . Smokeless tobacco: Never Used  . Alcohol use 1.2 oz/week    2 Standard drinks or equivalent per week   Allergies   Patient has no known allergies.  Review of Systems Review of Systems  Constitutional: Negative for appetite change and fever.  Respiratory: Negative for cough and shortness of breath.   Cardiovascular: Negative for chest pain.  Gastrointestinal: Negative for abdominal pain, diarrhea, nausea and vomiting.  Genitourinary: Negative for difficulty  urinating, dysuria, frequency, hematuria and urgency.  Musculoskeletal: Positive for back pain and myalgias.  Psychiatric/Behavioral: Negative for hallucinations and suicidal ideas.  All other systems reviewed and are negative.  Physical Exam Updated Vital Signs BP 121/65   Pulse 65   Temp 98.1 F (36.7 C) (Oral)   Resp 18   Ht 5\' 2"  (1.575 m)   Wt 59 kg   SpO2 99%   BMI 23.78 kg/m   Physical Exam  Constitutional: She appears well-developed and well-nourished. No distress.  HENT:  Head: Normocephalic and atraumatic.  Neck: Neck supple.  Cardiovascular: Normal rate and regular rhythm.   Pulmonary/Chest: Effort normal and breath sounds normal. No respiratory distress. She has no wheezes. She has no rales.  Abdominal: Soft. She exhibits no distension. There is no tenderness. There is no rebound and no guarding.  Neurological: She is alert.  Gait is normal  Skin: She is not diaphoretic.  Nursing note and vitals reviewed.  ED Treatments / Results  DIAGNOSTIC STUDIES: Oxygen Saturation is 99% on RA, normal by my interpretation.   COORDINATION OF CARE: 11:39 AM-Discussed next steps with pt. Pt verbalized understanding and is agreeable with the plan.   Labs (all labs ordered are listed, but only abnormal results are displayed) Labs Reviewed  RAPID URINE DRUG SCREEN, HOSP PERFORMED - Abnormal; Notable for the following:       Result Value   Cocaine POSITIVE (*)    Tetrahydrocannabinol POSITIVE (*)    All other components within normal limits  COMPREHENSIVE METABOLIC PANEL - Abnormal; Notable for the following:    CO2 21 (*)    Glucose, Bld 124 (*)    Calcium 8.6 (*)    Albumin 3.4 (*)    All other components within normal limits  ETHANOL  CBC WITH DIFFERENTIAL/PLATELET    EKG  EKG Interpretation None      Radiology No results found.  Procedures Procedures   Medications Ordered in ED Medications - No data to display  Initial Impression / Assessment and  Plan / ED Course  I have reviewed the triage vital signs and the nursing notes.  Pertinent labs & imaging results that were available during my care of the patient were reviewed by me and considered in my medical decision making (see chart for details).     Afebrile, nontoxic patient with multiple medical and social problems p/w request for detox from crack, marijuana, alcohol.  CIWA score is 1.  She has no serious signs of withdrawal.   Pt seen by case manager, Anola Gurney.  Please see her notes for further details.  She attempted to help patient find placement in facility but pt was not accepted.  CM also attempted to get pt into follow up with ID.  Resources provided. D/C home with librium taper, resources, ID/PCP follow up.  Discussed result, findings, treatment, and follow up  with patient.  Pt given return precautions.  Pt verbalizes understanding and agrees with plan.       Final Clinical Impressions(s) / ED  Diagnoses   Final diagnoses:  Polysubstance abuse   New Prescriptions Discharge Medication List as of 06/10/2016 11:47 AM    START taking these medications   Details  chlordiazePOXIDE (LIBRIUM) 25 MG capsule 50mg  PO TID x 1D, then 25-50mg  PO BID X 1D, then 25-50mg  PO QD X 1D, Print       I personally performed the services described in this documentation, which was scribed in my presence. The recorded information has been reviewed and is accurate.     Trixie Dredgemily Juwon Scripter, PA-C 06/10/16 1536    Arby BarrettePfeiffer, Marcy, MD 06/12/16 [phone]

## 2016-06-10 NOTE — Discharge Instructions (Signed)
Read the information below.  Use the prescribed medication as directed.  Please discuss all new medications with your pharmacist.  You may return to the Emergency Department at any time for worsening condition or any new symptoms that concern you.     Please use the medication INSTEAD of using drugs and alcohol.  This is to help your withdrawal symptoms.  Do not drink alcohol while taking this medication.  Please follow closely with the resources provided to get into detox.  Please follow up with your infectious disease or primary care doctors.  It is very important that you get back on the proper HIV medications.

## 2016-06-10 NOTE — ED Notes (Signed)
Case worker into see pt and to ger pt admitted to [facility], Labs drawn

## 2016-06-10 NOTE — ED Notes (Signed)
Part bus pass for pt from Child psychotherapistsocial worker

## 2016-06-10 NOTE — Progress Notes (Signed)
CSW provided patient with Part Bus pass and map of drop off locations.   Stacy GardnerErin Monzerrath Mcburney, LCSWA Clinical Social Worker [phone]

## 2016-06-10 NOTE — ED Notes (Signed)
Pt states wants detox from crack weed and etoh. States last used about 3 am, has run out of $ denies SI or HI

## 2016-06-10 NOTE — Care Management (Signed)
ED CM met with patient in [place], patient reports wanting detox from drugs and alcohol and restarting  HIV meds. CM discussed the [facility] clinic and [facility] program she states she was active with the program at one. CM explained that [facility] does not have a detox program but,  provided information for detox programs. [facility] contacted [facility] from [facility], [facility] is requesting medical clearance to evaluate for acceptance. CM updated [employer] . CM will obtain a release of infomation form and  fax requested information  to [facility]. Patient will need transport to [facility] if accepted CSW also consulted

## 2016-06-10 NOTE — ED Notes (Signed)
Pt given sandwhich and drink await social worker for ride to [facility]

## 2016-06-10 NOTE — ED Notes (Signed)
Pt very upset that she was not accepted wants to go now,

## 2016-06-10 NOTE — ED Triage Notes (Addendum)
Pt wants to go to a treatment center for detox from crack cocaine. Last use yesterday. Also drinks etoh, last drink yesterday. No SI/HI

## 2016-06-19 ENCOUNTER — Encounter (HOSPITAL_COMMUNITY): Payer: Self-pay | Admitting: Emergency Medicine

## 2016-06-19 ENCOUNTER — Emergency Department (HOSPITAL_COMMUNITY)
Admission: EM | Admit: 2016-06-19 | Discharge: 2016-06-20 | Disposition: A | Discharge status: Home / Self Care | Payer: MEDICAID | Attending: Emergency Medicine | Admitting: Emergency Medicine

## 2016-06-19 DIAGNOSIS — F1014 Alcohol abuse with alcohol-induced mood disorder: Secondary | ICD-10-CM | POA: Diagnosis present

## 2016-06-19 DIAGNOSIS — R45851 Suicidal ideations: Secondary | ICD-10-CM | POA: Diagnosis present

## 2016-06-19 DIAGNOSIS — F1290 Cannabis use, unspecified, uncomplicated: Secondary | ICD-10-CM | POA: Diagnosis not present

## 2016-06-19 DIAGNOSIS — F10120 Alcohol abuse with intoxication, uncomplicated: Secondary | ICD-10-CM | POA: Insufficient documentation

## 2016-06-19 DIAGNOSIS — F17210 Nicotine dependence, cigarettes, uncomplicated: Secondary | ICD-10-CM | POA: Insufficient documentation

## 2016-06-19 DIAGNOSIS — F10920 Alcohol use, unspecified with intoxication, uncomplicated: Secondary | ICD-10-CM

## 2016-06-19 DIAGNOSIS — F1490 Cocaine use, unspecified, uncomplicated: Secondary | ICD-10-CM | POA: Diagnosis not present

## 2016-06-19 LAB — CBC
HCT: 41.1 % (ref 36.0–46.0)
Hemoglobin: 14.1 g/dL (ref 12.0–15.0)
MCH: 28.5 pg (ref 26.0–34.0)
MCHC: 34.3 g/dL (ref 30.0–36.0)
MCV: 83.2 fL (ref 78.0–100.0)
PLATELETS: 239 10*3/uL (ref 150–400)
RBC: 4.94 MIL/uL (ref 3.87–5.11)
RDW: 13.6 % (ref 11.5–15.5)
WBC: 7.4 10*3/uL (ref 4.0–10.5)

## 2016-06-19 LAB — COMPREHENSIVE METABOLIC PANEL
ALT: 22 U/L (ref 14–54)
AST: 32 U/L (ref 15–41)
Albumin: 4 g/dL (ref 3.5–5.0)
Alkaline Phosphatase: 115 U/L (ref 38–126)
Anion gap: 13 (ref 5–15)
BILIRUBIN TOTAL: 0.6 mg/dL (ref 0.3–1.2)
BUN: 14 mg/dL (ref 6–20)
CO2: 17 mmol/L — ABNORMAL LOW (ref 22–32)
CREATININE: 0.93 mg/dL (ref 0.44–1.00)
Calcium: 8.8 mg/dL — ABNORMAL LOW (ref 8.9–10.3)
Chloride: 110 mmol/L (ref 101–111)
GFR calc Af Amer: >60 mL/min (ref >60)
Glucose, Bld: 87 mg/dL (ref 65–99)
Potassium: 3.7 mmol/L (ref 3.5–5.1)
Sodium: 140 mmol/L (ref 135–145)
TOTAL PROTEIN: 8.3 g/dL — AB (ref 6.5–8.1)

## 2016-06-19 LAB — SALICYLATE LEVEL: Salicylate Lvl: <7 mg/dL (ref 2.8–30.0)

## 2016-06-19 LAB — ACETAMINOPHEN LEVEL

## 2016-06-19 LAB — ETHANOL: Alcohol, Ethyl (B): 109 mg/dL — ABNORMAL HIGH (ref <5)

## 2016-06-19 NOTE — ED Triage Notes (Addendum)
Pt here via EMS with complaints of SI. Pt states she has been off her meds for 3 months  and has been having SI for 2 weeks. Pt states she does not live here, she is from [place]. Pt endorses ETOH today.  Pt denies changes other than her move here. And pt has no active plan for SI. Pt has superficial healed cuts from a razor blade to her chest. Pt states she last had a tetanus shot in 2016

## 2016-06-19 NOTE — ED Provider Notes (Signed)
[facility]-EMERGENCY DEPT Provider Note   CSN: [id number] Arrival date & time: 06/19/16  1529     History   Chief Complaint Chief Complaint  Patient presents with  . Suicidal    HPI Vanessa Cross is a 46 y.o. female. Chief complaint is suicidal thoughts.  HPI:  Patient presents for evaluation of feeling "hopeless, and suicidal."  Patient has history of depression. Is off medications for several months per her report.  She states that she was feeling suicidal 3 weeks ago and took a knife and cut herself across the chest she has small well-healed lacerations from this. Initially fell she had no plan. When asked specifically she states "I don't know, I guess I might just take a bunch of pills". She denies any such efforts today.  Past Medical History:  Diagnosis Date  . HIV (human immunodeficiency virus infection) [facility](HCC)     Patient Active Problem List   Diagnosis Date Noted  . 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 Tobacco use disorder 10/11/2013  . 2012 10/11/2013  . Genital herpes 10/11/2013  . History of trichomoniasis 10/11/2013  . History of gonorrhea 10/11/2013    Past Surgical History:  Procedure Laterality Date  . ABDOMINAL HYSTERECTOMY  2008    OB History    No data available       Home Medications    Prior to Admission medications   Medication Sig Start Date End Date Taking? Authorizing Provider  benztropine (COGENTIN) 1 MG tablet Take 1 mg by mouth 2 (two) times daily.    [provider]  carbamazepine (TEGRETOL) 200 MG tablet Take 200 mg by mouth 2 (two) times daily.    [provider]  chlordiazePOXIDE (LIBRIUM) 25 MG capsule 50mg  PO TID x 1D, then 25-50mg  PO BID X 1D, then 25-50mg  PO QD X 1D Patient not taking: Reported on 06/19/2016 06/10/16   Trixie DredgeWest, Emily, PA-C  darunavir-cobicistat (PREZCOBIX) 800-150 MG per tablet Take 1 tablet by mouth daily. Swallow whole. Do NOT crush, break or chew tablets. Take with  food. Patient not taking: Reported on 06/19/2016 12/02/13   Gardiner Barefootomer, Robert W, MD  emtricitabine-tenofovir (TRUVADA) 200-300 MG per tablet Take 1 tablet by mouth daily. Patient not taking: Reported on 06/19/2016 12/02/13   Gardiner Barefootomer, Robert W, MD  fluconazole (DIFLUCAN) 150 MG tablet Take 1 tablet (150 mg total) by mouth daily. Patient not taking: Reported on 06/19/2016 12/19/13   Judyann MunsonSnider, Cynthia, MD  haloperidol (HALDOL) 5 MG tablet Take 5 mg by mouth 2 (two) times daily.    [provider]  valACYclovir (VALTREX) 1000 MG tablet Take 1 tablet (1,000 mg total) by mouth daily. Patient not taking: Reported on 06/19/2016 10/22/13   Judyann MunsonSnider, Cynthia, MD    Family History No family history on file.  Social History Social History  Substance Use Topics  . Smoking status: Current Every Day Smoker    Packs/day: 0.50    Years: 20.00    Types: Cigarettes  . Smokeless tobacco: Never Used  . Alcohol use 1.2 oz/week    2 Standard drinks or equivalent per week     Allergies   Patient has no known allergies.   Review of Systems Review of Systems  Constitutional: Negative for appetite change, chills, diaphoresis, fatigue and fever.  HENT: Negative for mouth sores, sore throat and trouble swallowing.   Eyes: Negative for visual disturbance.  Respiratory: Negative for cough, chest tightness, shortness of breath and wheezing.   Cardiovascular: Negative for chest  pain.  Gastrointestinal: Negative for abdominal distention, abdominal pain, diarrhea, nausea and vomiting.  Endocrine: Negative for polydipsia, polyphagia and polyuria.  Genitourinary: Negative for dysuria, frequency and hematuria.  Musculoskeletal: Negative for gait problem.  Skin: Negative for color change, pallor and rash.  Neurological: Negative for dizziness, syncope, light-headedness and headaches.  Hematological: Does not bruise/bleed easily.  Psychiatric/Behavioral: Positive for dysphoric mood and suicidal ideas. Negative for  behavioral problems and confusion.     Physical Exam Updated Vital Signs BP 118/64 (BP Location: Right Arm)   Pulse 82   Temp 99.6 F (37.6 C) (Oral)   Resp 16   SpO2 96%   Physical Exam  Constitutional: She is oriented to person, place, and time. She appears well-developed and well-nourished. No distress.  HENT:  Head: Normocephalic.  Eyes: Conjunctivae are normal. Pupils are equal, round, and reactive to light. No scleral icterus.  Neck: Normal range of motion. Neck supple. No thyromegaly present.  Cardiovascular: Normal rate and regular rhythm.  Exam reveals no gallop and no friction rub.   No murmur heard. Well-healed wound consistent with laceration across the upper chest.  Pulmonary/Chest: Effort normal and breath sounds normal. No respiratory distress. She has no wheezes. She has no rales.  Abdominal: Soft. Bowel sounds are normal. She exhibits no distension. There is no tenderness. There is no rebound.  Musculoskeletal: Normal range of motion.  Neurological: She is alert and oriented to person, place, and time.  Skin: Skin is warm and dry. No rash noted.  Psychiatric: She has a normal mood and affect. Her behavior is normal.     ED Treatments / Results  Labs (all labs ordered are listed, but only abnormal results are displayed) Labs Reviewed  COMPREHENSIVE METABOLIC PANEL - Abnormal; Notable for the following:       Result Value   CO2 17 (*)    Calcium 8.8 (*)    Total Protein 8.3 (*)    All other components within normal limits  ETHANOL - Abnormal; Notable for the following:    Alcohol, Ethyl (B) 109 (*)    All other components within normal limits  ACETAMINOPHEN LEVEL - Abnormal; Notable for the following:    Acetaminophen (Tylenol), Serum <10 (*)    All other components within normal limits  SALICYLATE LEVEL  CBC  RAPID URINE DRUG SCREEN, HOSP PERFORMED    EKG  EKG Interpretation None       Radiology No results found.  Procedures Procedures  (including critical care time)  Medications Ordered in ED Medications - No data to display   Initial Impression / Assessment and Plan / ED Course  I have reviewed the triage vital signs and the nursing notes.  Pertinent labs & imaging results that were available during my care of the patient were reviewed by me and considered in my medical decision making (see chart for details).     IVC written. Patient : Cooperative here. She is inconsistent with her reports of her suicidal thoughts or plans. Behavioral health staff is recommended reevaluation in a.m. Alcohol, level is over 100 here.  Final Clinical Impressions(s) / ED Diagnoses   Final diagnoses:  Suicidal thoughts  Alcoholic intoxication without complication [facility])    Patient stated that she would consider hurting herself "if I don't get some help". If upon sobriety the patient is no longer suicidal and is appropriate discharge follow-up plan is able to be established, I would feel comfortable with this patient undergoing outpatient treatment. Await re-eval in a.m.  New Prescriptions New Prescriptions   No medications on file     Rolland Porter, MD 06/19/16 2146
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
## 2016-06-20 DIAGNOSIS — F17210 Nicotine dependence, cigarettes, uncomplicated: Secondary | ICD-10-CM

## 2016-06-20 DIAGNOSIS — F1014 Alcohol abuse with alcohol-induced mood disorder: Secondary | ICD-10-CM

## 2016-06-20 DIAGNOSIS — F1290 Cannabis use, unspecified, uncomplicated: Secondary | ICD-10-CM

## 2016-06-20 DIAGNOSIS — F1490 Cocaine use, unspecified, uncomplicated: Secondary | ICD-10-CM

## 2016-06-20 NOTE — BH Assessment (Signed)
[facility] Assessment Progress Note  Per Thedore MinsMojeed Akintayo, MD, this pt does not require psychiatric hospitalization at this time.  Pt presents under IVC initiated by EDP Rolland PorterMark James, MD, which Dr Jannifer FranklinAkintayo has rescinded.  [facility] and for [facility].  These referrals have been included in pt's discharge instructions.  Pt's nurse, Verlon AuLeslie, has been notified.  Doylene Canninghomas Stanton Kissoon, MA Triage Specialist [phone]

## 2016-06-20 NOTE — ED Notes (Signed)
Patient had phone in her room.  Patient became upset with MD and requested to be D/C.

## 2016-06-20 NOTE — ED Notes (Signed)
Patient understood discharge instructions.  She has no questions or concerns.

## 2016-06-20 NOTE — Discharge Instructions (Signed)
For your ongoing mental health needs, you are advised to follow up with [facility].  New and returning patients are seen at their walk-in clinic.  Walk-in hours are Monday - Friday from 8:00 am - 3:00 pm.  Walk-in patients are seen on a first come, first served basis.  Try to arrive as early as possible for he best chance of being seen the same day:       [facility]      [address]      [place], [place] [address]      [phone]  If you return to the [place] area, contact [facility] to find a service provider in your community:       [facility]      [address]      [place], [place] [address]      [phone]

## 2016-06-20 NOTE — Consult Note (Signed)
[facility] Consult   Reason for Consult:  Alcohol abuse with suicidal ideations Referring Physician:  EDP Patient Identification: Vanessa Cross MRN:  [id number] Principal Diagnosis: Alcohol abuse with alcohol-induced mood disorder [facility]) Diagnosis:   Patient Active Problem List   Diagnosis Date Noted  . Alcohol abuse with alcohol-induced mood disorder ([place]) [F10.14] 06/20/2016    Priority: High  . Unspecified viral hepatitis C without hepatic coma [B19.20] 10/11/2013  . Tobacco use disorder [F17.200] 10/11/2013  . 2012 [P10.258] 10/11/2013  . Genital herpes [A60.00] 10/11/2013  . History of trichomoniasis [Z86.19] 10/11/2013  . History of gonorrhea [Z86.19] 10/11/2013    Total Time spent with patient: 45 minutes  Subjective:   Vanessa Cross is a 46 y.o. female patient does not warrant admission.  HPI:  46 yo female presented to the ED after using alcohol and cocaine with suicidal ideations.  Today, she does not have suicidal or homicidal ideations, nor hallucinations or withdrawal symptoms.  She demands to go to [place] and wants [place] to provide transportation there.  When she was told this is not feasible, she demanded to leave.  She has been at the [facility] for 2 months, moved from [place].  Reports being off her depression medications, encouraged her to go to [facility] for these.  Past Psychiatric History: substance abuse, depression  Risk to Self: NOne Risk to Others: Homicidal Ideation: No (Pt denies. ) Thoughts of Harm to Others: No Current Homicidal Intent: No Current Homicidal Plan: No Access to Homicidal Means: No Identified Victim: NA History of harm to others?: No Assessment of Violence:  (UTA) Violent Behavior Description: UTA Does patient have access to weapons?: No (Pt denies. ) Criminal Charges Pending?: No Does patient have a court date: No Prior Inpatient Therapy: Prior Inpatient Therapy:  (UTA) Prior Therapy Dates: UTA Prior Therapy  Facilty/Provider(s): UTA Reason for Treatment: UTA Prior Outpatient Therapy: Prior Outpatient Therapy:  (UTA) Prior Therapy Dates: UTA Prior Therapy Facilty/Provider(s): UTA Reason for Treatment: UTA Does patient have an ACCT team?:  (UTA) Does patient have Intensive In-House Services?  :  (UTA) Does patient have [facility] services? :  (UTA) Does patient have P4CC services?:  (UTA)  Past Medical History:  Past Medical History:  Diagnosis Date  . HIV (human immunodeficiency virus infection) ([place])     Past Surgical History:  Procedure Laterality Date  . ABDOMINAL HYSTERECTOMY  2008   Family History: No family history on file. Family Psychiatric  History: unknown Social History:  History  Alcohol Use  . 1.2 oz/week  . 2 Standard drinks or equivalent per week     History  Drug Use  . Frequency: 7.0 times per week  . Types: "Crack" cocaine, Cocaine, Marijuana    Comment: daily crack use     Social History   Social History  . Marital status: Single    Spouse name: N/A  . Number of children: N/A  . Years of education: N/A   Social History Main Topics  . Smoking status: Current Every Day Smoker    Packs/day: 0.50    Years: 20.00    Types: Cigarettes  . Smokeless tobacco: Never Used  . Alcohol use 1.2 oz/week    2 Standard drinks or equivalent per week  . Drug use: Yes    Frequency: 7.0 times per week    Types: "Crack" cocaine, Cocaine, Marijuana     Comment: daily crack use   . Sexual activity: Yes    Partners: Male  Birth control/ protection: Condom     Comment: patient given condoms   Other Topics Concern  . None   Social History Narrative  . None   Additional Social History:    Allergies:  No Known Allergies  Labs:  Results for orders placed or performed during the hospital encounter of 06/19/16 (from the past 48 hour(s))  Comprehensive metabolic panel     Status: Abnormal   Collection Time: 06/19/16  3:49 PM  Result Value Ref Range   Sodium 140  135 - 145 mmol/L   Potassium 3.7 3.5 - 5.1 mmol/L   Chloride 110 101 - 111 mmol/L   CO2 17 (L) 22 - 32 mmol/L   Glucose, Bld 87 65 - 99 mg/dL   BUN 14 6 - 20 mg/dL   Creatinine, Ser 0.93 0.44 - 1.00 mg/dL   Calcium 8.8 (L) 8.9 - 10.3 mg/dL   Total Protein 8.3 (H) 6.5 - 8.1 g/dL   Albumin 4.0 3.5 - 5.0 g/dL   AST 32 15 - 41 U/L   ALT 22 14 - 54 U/L   Alkaline Phosphatase 115 38 - 126 U/L   Total Bilirubin 0.6 0.3 - 1.2 mg/dL   GFR calc non Af Amer >60 >60 mL/min   GFR calc Af Amer >60 >60 mL/min    Comment: (NOTE) The eGFR has been calculated using the CKD EPI equation. This calculation has not been validated in all clinical situations. eGFR's persistently <60 mL/min signify possible Chronic Kidney Disease.    Anion gap 13 5 - 15  Ethanol     Status: Abnormal   Collection Time: 06/19/16  3:49 PM  Result Value Ref Range   Alcohol, Ethyl (B) 109 (H) <5 mg/dL    Comment:        LOWEST DETECTABLE LIMIT FOR SERUM ALCOHOL IS 5 mg/dL FOR MEDICAL PURPOSES ONLY   Salicylate level     Status: None   Collection Time: 06/19/16  3:49 PM  Result Value Ref Range   Salicylate Lvl <3.0 2.8 - 30.0 mg/dL  Acetaminophen level     Status: Abnormal   Collection Time: 06/19/16  3:49 PM  Result Value Ref Range   Acetaminophen (Tylenol), Serum <10 (L) 10 - 30 ug/mL    Comment:        THERAPEUTIC CONCENTRATIONS VARY SIGNIFICANTLY. A RANGE OF 10-30 ug/mL MAY BE AN EFFECTIVE CONCENTRATION FOR MANY PATIENTS. HOWEVER, SOME ARE BEST TREATED AT CONCENTRATIONS OUTSIDE THIS RANGE. ACETAMINOPHEN CONCENTRATIONS >150 ug/mL AT 4 HOURS AFTER INGESTION AND >50 ug/mL AT 12 HOURS AFTER INGESTION ARE OFTEN ASSOCIATED WITH TOXIC REACTIONS.   cbc     Status: None   Collection Time: 06/19/16  3:49 PM  Result Value Ref Range   WBC 7.4 4.0 - 10.5 K/uL   RBC 4.94 3.87 - 5.11 MIL/uL   Hemoglobin 14.1 12.0 - 15.0 g/dL   HCT 41.1 36.0 - 46.0 %   MCV 83.2 78.0 - 100.0 fL   MCH 28.5 26.0 - 34.0 pg   MCHC  34.3 30.0 - 36.0 g/dL   RDW 13.6 11.5 - 15.5 %   Platelets 239 150 - 400 K/uL    No current facility-administered medications for this encounter.    Current Outpatient Prescriptions  Medication Sig Dispense Refill  . benztropine (COGENTIN) 1 MG tablet Take 1 mg by mouth 2 (two) times daily.    . carbamazepine (TEGRETOL) 200 MG tablet Take 200 mg by mouth 2 (two) times daily.    Marland Kitchen  chlordiazePOXIDE (LIBRIUM) 25 MG capsule 79m PO TID x 1D, then 25-531mPO BID X 1D, then 25-5031mO QD X 1D (Patient not taking: Reported on 06/19/2016) 10 capsule 0  . darunavir-cobicistat (PREZCOBIX) 800-150 MG per tablet Take 1 tablet by mouth daily. Swallow whole. Do NOT crush, break or chew tablets. Take with food. (Patient not taking: Reported on 06/19/2016) 30 tablet 5  . emtricitabine-tenofovir (TRUVADA) 200-300 MG per tablet Take 1 tablet by mouth daily. (Patient not taking: Reported on 06/19/2016) 30 tablet 5  . fluconazole (DIFLUCAN) 150 MG tablet Take 1 tablet (150 mg total) by mouth daily. (Patient not taking: Reported on 06/19/2016) 1 tablet 0  . haloperidol (HALDOL) 5 MG tablet Take 5 mg by mouth 2 (two) times daily.    . valACYclovir (VALTREX) 1000 MG tablet Take 1 tablet (1,000 mg total) by mouth daily. (Patient not taking: Reported on 06/19/2016) 30 tablet 11    Musculoskeletal: Strength & Muscle Tone: within normal limits Gait & Station: normal Patient leans: N/A  Psychiatric Specialty Exam: Physical Exam  Constitutional: She appears well-developed and well-nourished.  HENT:  Head: Normocephalic.  Neck: Normal range of motion.  Respiratory: Effort normal.  Musculoskeletal: Normal range of motion.  Neurological: She is alert.  Psychiatric: Her speech is normal and behavior is normal. Judgment and thought content normal. Her affect is angry. Cognition and memory are normal.    Review of Systems  Psychiatric/Behavioral: Positive for substance abuse.  All other systems reviewed and are  negative.   Blood pressure 108/69, pulse 70, temperature 99.3 F (37.4 C), temperature source Oral, resp. rate 16, SpO2 96 %.There is no height or weight on file to calculate BMI.  General Appearance: Disheveled  Eye Contact:  Good  Speech:  Normal Rate  Volume:  Normal  Mood:  Angry  Affect:  Congruent  Thought Process:  Coherent and Descriptions of Associations: Intact  Orientation:  Full (Time, Place, and Person)  Thought Content:  WDL and Logical  Suicidal Thoughts:  No  Homicidal Thoughts:  No  Memory:  Immediate;   Good Recent;   Good Remote;   Good  Judgement:  Fair  Insight:  Fair  Psychomotor Activity:  Normal  Concentration:  Concentration: Good and Attention Span: Good  Recall:  Good  Fund of Knowledge:  Fair  Language:  Good  Akathisia:  No  Handed:  Right  AIMS (if indicated):     Assets:  Leisure Time Physical Health Resilience  ADL's:  Intact  Cognition:  WNL  Sleep:        Treatment Plan Summary: Daily contact with patient to assess and evaluate symptoms and progress in treatment, Medication management and Plan alcohol abuse with alcohol induced mood disorder:  -Crisis stabilization -Medication management:  No medications started lasted night due to patient needing to clear the cocaine and alcohol -Individual and substance abuse counseling  Disposition: No evidence of imminent risk to self or others at present.    LORWaylan BogaP 06/20/2016 10:04 AM  Patient seen face-to-face for psychiatric evaluation, chart reviewed and case discussed with the physician extender and developed treatment plan. Reviewed the information documented and agree with the treatment plan. MojCorena PilgrimD

## 2016-06-21 NOTE — [facility] Suicide Risk Assessment (Signed)
Suicide Risk Assessment  Discharge Assessment   [facility]   Principal Problem: Alcohol abuse with alcohol-induced mood disorder [facility](HCC) Discharge Diagnoses:  Patient Active Problem List   Diagnosis Date Noted  . Alcohol abuse with alcohol-induced mood disorder (HCC) [F10.14] 06/20/2016    Priority: High  . Unspecified viral hepatitis C without hepatic coma [B19.20] 10/11/2013  . Tobacco use disorder [F17.200] 10/11/2013  . 2012 [W09.811][Z87.898] 10/11/2013  . Genital herpes [A60.00] 10/11/2013  . History of trichomoniasis [Z86.19] 10/11/2013  . History of gonorrhea [Z86.19] 10/11/2013    Total Time spent with patient: 45 minutes   Musculoskeletal: Strength & Muscle Tone: within normal limits Gait & Station: normal Patient leans: N/A  Psychiatric Specialty Exam: Physical Exam  Constitutional: She appears well-developed and well-nourished.  HENT:  Head: Normocephalic.  Neck: Normal range of motion.  Respiratory: Effort normal.  Musculoskeletal: Normal range of motion.  Neurological: She is alert.  Psychiatric: Her speech is normal and behavior is normal. Judgment and thought content normal. Her affect is angry. Cognition and memory are normal.    Review of Systems  Psychiatric/Behavioral: Positive for substance abuse.  All other systems reviewed and are negative.   Blood pressure 108/69, pulse 70, temperature 99.3 F (37.4 C), temperature source Oral, resp. rate 16, SpO2 96 %.There is no height or weight on file to calculate BMI.  General Appearance: Disheveled  Eye Contact:  Good  Speech:  Normal Rate  Volume:  Normal  Mood:  Angry  Affect:  Congruent  Thought Process:  Coherent and Descriptions of Associations: Intact  Orientation:  Full (Time, Place, and Person)  Thought Content:  WDL and Logical  Suicidal Thoughts:  No  Homicidal Thoughts:  No  Memory:  Immediate;   Good Recent;   Good Remote;   Good  Judgement:  Fair  Insight:  Fair   Psychomotor Activity:  Normal  Concentration:  Concentration: Good and Attention Span: Good  Recall:  Good  Fund of Knowledge:  Fair  Language:  Good  Akathisia:  No  Handed:  Right  AIMS (if indicated):     Assets:  Leisure Time Physical Health Resilience  ADL's:  Intact  Cognition:  WNL  Sleep:      Mental Status Per Nursing Assessment::   On Admission:   alcohol and cocaine abuse with suicidal ideations  Demographic Factors:  NA  Loss Factors: NA  Historical Factors: NA  Risk Reduction Factors:   Sense of responsibility to family  Continued Clinical Symptoms:  Anger   Cognitive Features That Contribute To Risk:  None    Suicide Risk:  Minimal: No identifiable suicidal ideation.  Patients presenting with no risk factors but with morbid ruminations; may be classified as minimal risk based on the severity of the depressive symptoms    Plan Of Care/Follow-up recommendations:  Activity:  as tolerated Diet:  heart healthy diet  LORD, JAMISON, NP 06/21/2016, 9:36 AM

## 2016-06-29 LAB — GLUCOSE, POCT (MANUAL RESULT ENTRY): POC GLUCOSE: 112 mg/dL — AB (ref 70–99)

## 2016-07-01 ENCOUNTER — Encounter: Payer: Self-pay | Admitting: Pediatric Intensive Care

## 2016-07-07 ENCOUNTER — Ambulatory Visit: Payer: Self-pay

## 2016-07-07 ENCOUNTER — Ambulatory Visit: Payer: Self-pay | Admitting: Internal Medicine

## 2016-07-14 NOTE — Congregational Nurse Program (Signed)
Congregational Nurse Program Note  Date of Encounter: 07/01/2016  Past Medical History: Past Medical History:  Diagnosis Date  . HIV (human immunodeficiency virus infection) (HCC)     Encounter Details:     CNP Questionnaire - 07/01/16 1145      Patient Demographics   Is this a new or existing patient? New   Patient is considered a/an Not Applicable   Race African-American/Black     Patient Assistance   Location of Patient Assistance [facility]   Patient's financial/insurance status Self-Pay (Uninsured);Low Income   Uninsured Patient ([place] Card/Care Connects) Yes   Interventions Counseled to make appt. with provider   Patient referred to apply for the following financial assistance Medicaid   Food insecurities addressed Not Applicable   Transportation assistance Yes   Type of Assistance Bus Pass Given   Assistance securing medications No   Educational health offerings Navigating the healthcare system     Encounter Details   Primary purpose of visit Navigating the Healthcare System   Was an Emergency Department visit averted? Not Applicable   Does patient have a medical provider? Yes   Patient referred to Clinic   Was a mental health screening completed? (GAINS tool) No   Does patient have dental issues? No   Does patient have vision issues? No   Does your patient have an abnormal blood pressure today? No   Since previous encounter, have you referred patient for abnormal blood pressure that resulted in a new diagnosis or medication change? No   Does your patient have an abnormal blood glucose today? No   Since previous encounter, have you referred patient for abnormal blood glucose that resulted in a new diagnosis or medication change? No   Was there a life-saving intervention made? No      Client [facility]States she has not had HIV or BH medication in 3 months. She needs to transfer Medicaid.

## 2016-07-27 NOTE — Congregational Nurse Program (Signed)
Congregational Nurse Program Note  Date of Encounter: 07/27/2016  Past Medical History: Past Medical History:  Diagnosis Date  . HIV (human immunodeficiency virus infection) (HCC)     Encounter Details:     CNP Questionnaire - 07/27/16 1515      Patient Demographics   Is this a new or existing patient? New   Patient is considered a/an Not Applicable   Race Caucasian/White     Patient Assistance   Location of Patient Assistance Not Applicable   Patient's financial/insurance status Low Income;Medicaid   Uninsured Patient ([employer] Research officer, trade unionCard/Care Connects) Yes   Patient referred to apply for the following financial assistance Not Applicable   Food insecurities addressed Not Applicable   Transportation assistance No   Assistance securing medications No   Educational health offerings Behavioral health;Navigating the healthcare system     Encounter Details   Primary purpose of visit Chronic Illness/Condition Visit;Navigating the Healthcare System   Was an Emergency Department visit averted? Not Applicable   Does patient have a medical provider? Yes   Patient referred to Doctor referral for a non-emergent behavioral health crisis   Was a mental health screening completed? (GAINS tool) No   Does patient have dental issues? No   Does patient have vision issues? No   Does your patient have an abnormal blood pressure today? No   Since previous encounter, have you referred patient for abnormal blood pressure that resulted in a new diagnosis or medication change? No   Does your patient have an abnormal blood glucose today? No   Since previous encounter, have you referred patient for abnormal blood glucose that resulted in a new diagnosis or medication change? No   Was there a life-saving intervention made? No     States has been off "my mental health drugs" for about six months and states she needs to go back on them.  Client has medicaid, [facility]  Client states has an  appointment with [facility] the first of July and will talk to her provider about resuming medications.  Encouraged client to do so.

## 2016-08-15 ENCOUNTER — Ambulatory Visit (INDEPENDENT_AMBULATORY_CARE_PROVIDER_SITE_OTHER): Payer: Medicaid Other | Admitting: Internal Medicine

## 2016-08-15 ENCOUNTER — Encounter: Payer: Self-pay | Admitting: Internal Medicine

## 2016-08-15 VITALS — BP 108/74 | HR 72 | Temp 98.2°F | Ht 62.0 in | Wt 151.0 lb

## 2016-08-15 DIAGNOSIS — B20 Human immunodeficiency virus [HIV] disease: Secondary | ICD-10-CM

## 2016-08-15 DIAGNOSIS — Z23 Encounter for immunization: Secondary | ICD-10-CM | POA: Diagnosis not present

## 2016-08-15 LAB — CBC WITH DIFFERENTIAL/PLATELET
Basophils Absolute: 0 cells/uL (ref 0–200)
Basophils Relative: 0 %
EOS ABS: 51 {cells}/uL (ref 15–500)
Eosinophils Relative: 1 %
HCT: 36 % (ref 35.0–45.0)
HEMOGLOBIN: 11.7 g/dL (ref 11.7–15.5)
LYMPHS PCT: 38 %
Lymphs Abs: 1938 cells/uL (ref 850–3900)
MCH: 27.5 pg (ref 27.0–33.0)
MCHC: 32.5 g/dL (ref 32.0–36.0)
MCV: 84.7 fL (ref 80.0–100.0)
MONO ABS: 561 {cells}/uL (ref 200–950)
MPV: 11 fL (ref 7.5–12.5)
Monocytes Relative: 11 %
NEUTROS PCT: 50 %
Neutro Abs: 2550 cells/uL (ref 1500–7800)
Platelets: 187 10*3/uL (ref 140–400)
RBC: 4.25 MIL/uL (ref 3.80–5.10)
RDW: 14.2 % (ref 11.0–15.0)
WBC: 5.1 10*3/uL (ref 3.8–10.8)

## 2016-08-15 MED ORDER — VALACYCLOVIR HCL 1 G PO TABS: 1000.0000 mg | ORAL_TABLET | Freq: Every day | ORAL | 11 refills | Status: DC

## 2016-08-15 MED ORDER — ALBUTEROL SULFATE HFA 108 (90 BASE) MCG/ACT IN AERS: 2.0000 | INHALATION_SPRAY | Freq: Four times a day (QID) | RESPIRATORY_TRACT | 2 refills | Status: DC | PRN

## 2016-08-15 MED ORDER — DARUNAVIR-COBICISTAT 800-150 MG PO TABS: 1.0000 | ORAL_TABLET | Freq: Every day | ORAL | 11 refills | Status: DC

## 2016-08-15 MED ORDER — SULFAMETHOXAZOLE-TRIMETHOPRIM 800-160 MG PO TABS: 1.0000 | ORAL_TABLET | Freq: Every day | ORAL | 2 refills | Status: DC

## 2016-08-15 MED ORDER — EMTRICITABINE-TENOFOVIR AF 200-25 MG PO TABS: 1.0000 | ORAL_TABLET | Freq: Every day | ORAL | 11 refills | Status: DC

## 2016-08-15 MED ORDER — VALACYCLOVIR HCL 1 G PO TABS: 1000.0000 mg | ORAL_TABLET | Freq: Every day | ORAL | 5 refills | Status: DC

## 2016-08-15 MED ORDER — FLUCONAZOLE 100 MG PO TABS: 100.0000 mg | ORAL_TABLET | ORAL | 0 refills | Status: DC

## 2016-08-15 NOTE — Progress Notes (Signed)
HPI: Vanessa Cross is a 46 y.o. female who presents to the [facility] to follow-up with Dr. Drue SecondSnider for her HIV infection.  Allergies: No Known Allergies  Past Medical History: Past Medical History:  Diagnosis Date  . HIV (human immunodeficiency virus infection) (HCC)     Social History: Social History   Social History  . Marital status: Single    Spouse name: N/A  . Number of children: N/A  . Years of education: N/A   Social History Main Topics  . Smoking status: Current Every Day Smoker    Packs/day: 0.50    Years: 20.00    Types: Cigarettes  . Smokeless tobacco: Never Used  . Alcohol use 1.2 oz/week    2 Standard drinks or equivalent per week  . Drug use: Yes    Frequency: 7.0 times per week    Types: "Crack" cocaine, Cocaine, Marijuana     Comment: daily crack use   . Sexual activity: Yes    Partners: Male    Birth control/ protection: Condom     Comment: patient given condoms   Other Topics Concern  . None   Social History Narrative  . None    Current Regimen: None  Labs: HIV 1 RNA Quant (copies/mL)  Date Value  11/20/2014 230,241 (H)  03/11/2014 100,831 (H)  12/19/2013 3,059 (H)   CD4 T Cell Abs (/uL)  Date Value  11/20/2014 310 (L)  03/11/2014 430  12/19/2013 610   Hep B S Ab (no units)  Date Value  10/08/2013 NEG   Hepatitis B Surface Ag (no units)  Date Value  10/08/2013 NEGATIVE   HCV Ab (no units)  Date Value  10/08/2013 REACTIVE (A)    CrCl: CrCl cannot be calculated (Patient's most recent lab result is older than the maximum 21 days allowed.).  Lipids:    Component Value Date/Time   CHOL 160 11/20/2014 1134   TRIG 57 11/20/2014 1134   HDL 72 11/20/2014 1134   CHOLHDL 2.2 11/20/2014 1134   VLDL 11 11/20/2014 1134   LDLCALC 77 11/20/2014 1134    Assessment: Vanessa Cross is here today to follow-up for her HIV infection. She has been out of care since Fall 2015, off of medications x 6 months she says.  She was previously on  Prezcobix and Truvada and will restart her on Prezcobix and Descovy now. She has Medicaid but living in a drug rehab program, so hard copies of prescriptions were given to her. Will bring her back to see pharmacy in 2-3 weeks for adherence counseling and to see how she is doing. Will review her labs before then.   Plans: - Restart Prezcobix + Descovy - F/u with pharmacy 8/1 at 1:30pm  Cassie L. Kuppelweiser, PharmD, CPP Infectious Diseases Clinical Pharmacist [facility] 08/15/2016, 2:37 PM

## 2016-08-15 NOTE — Progress Notes (Signed)
Pt given food today from the pantry and requests to speak to [facility].

## 2016-08-15 NOTE — Progress Notes (Signed)
RFV: returning into care after Fall 2015  Patient ID: Vanessa Cross, female   DOB: [birth year], 46 y.o.   MRN: [id number]  HPI 46yo F with history of bipolar, HIV disease, chronic Hep C, cocaine misuse disorder. We last saw her in Fall of 2015.Last labs in Fall 2016 showed CD 4 count of 310/VL 230K. Previously on truvada/prezcobix, which she states she had been taking up until 6 months ago and getting care through a physician in [place], [place].  She is now staying at a hotel associated with drug rehab - 6 month program. She is in classes every weekday from 10a-2 pm. She denies any recent hospitalizations. She is motivated to go through rehab in order to get treatment for hepatitis c   She is to see cassie for adherence counseling  Outpatient Encounter Prescriptions as of 08/15/2016  Medication Sig  . benztropine (COGENTIN) 1 MG tablet Take 1 mg by mouth 2 (two) times daily.  . carbamazepine (TEGRETOL) 200 MG tablet Take 200 mg by mouth 2 (two) times daily.  . chlordiazePOXIDE (LIBRIUM) 25 MG capsule 50mg  PO TID x 1D, then 25-50mg  PO BID X 1D, then 25-50mg  PO QD X 1D (Patient not taking: Reported on 06/19/2016)  . darunavir-cobicistat (PREZCOBIX) 800-150 MG per tablet Take 1 tablet by mouth daily. Swallow whole. Do NOT crush, break or chew tablets. Take with food. (Patient not taking: Reported on 06/19/2016)  . emtricitabine-tenofovir (TRUVADA) 200-300 MG per tablet Take 1 tablet by mouth daily. (Patient not taking: Reported on 06/19/2016)  . fluconazole (DIFLUCAN) 150 MG tablet Take 1 tablet (150 mg total) by mouth daily. (Patient not taking: Reported on 06/19/2016)  . haloperidol (HALDOL) 5 MG tablet Take 5 mg by mouth 2 (two) times daily.  . valACYclovir (VALTREX) 1000 MG tablet Take 1 tablet (1,000 mg total) by mouth daily. (Patient not taking: Reported on 06/19/2016)   No facility-administered encounter medications on file as of 08/15/2016.      Patient Active Problem List   Diagnosis Date Noted   . Alcohol abuse with alcohol-induced mood disorder (HCC) 06/20/2016  . 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 Tobacco use disorder 10/11/2013  . 2012 10/11/2013  . Genital herpes 10/11/2013  . History of trichomoniasis 10/11/2013  . History of gonorrhea 10/11/2013     Health Maintenance Due  Topic Date Due  . TETANUS/TDAP  02/06/1988  . PAP SMEAR  04/03/2016    Social History  Substance Use Topics  . Smoking status: Current Every Day Smoker    Packs/day: 0.50    Years: 20.00    Types: Cigarettes  . Smokeless tobacco: Never Used  . Alcohol use 1.2 oz/week    2 Standard drinks or equivalent per week   Review of Systems  Constitutional: Negative for fever, chills, diaphoresis, activity change, appetite change, fatigue and unexpected weight change.  HENT: Negative for congestion, sore throat, rhinorrhea, sneezing, trouble swallowing and sinus pressure.  Eyes: Negative for photophobia and visual disturbance.  Respiratory: Negative for cough, chest tightness, shortness of breath, wheezing and stridor.  Cardiovascular: Negative for chest pain, palpitations and leg swelling.  Gastrointestinal: Negative for nausea, vomiting, abdominal pain, diarrhea, constipation, blood in stool, abdominal distention and anal bleeding.  Genitourinary: Negative for dysuria, hematuria, flank pain and difficulty urinating.  Musculoskeletal: Negative for myalgias, back pain, joint swelling, arthralgias and gait problem.  Skin: occasional pruritic rash Neurological: Negative for dizziness, tremors, weakness and light-headedness.  Hematological: Negative for adenopathy. Does not bruise/bleed  easily.  Psychiatric/Behavioral: Negative for behavioral problems, confusion, sleep disturbance, dysphoric mood, decreased concentration and agitation.    Physical Exam   BP 108/74   Pulse 72   Temp 98.2 F (36.8 C) (Oral)   Ht 5\' 2"  (1.575 m)   Wt 151 lb (68.5 kg)   BMI 27.62 kg/m     Physical Exam  Constitutional:  oriented to person, place, and time. appears well-developed and well-nourished. No distress.  HENT: [place]/AT, PERRLA, no scleral icterus Mouth/Throat: Oropharynx is clear and moist. No oropharyngeal exudate.  Cardiovascular: Normal rate, regular rhythm and normal heart sounds. Exam reveals no gallop and no friction rub.  No murmur heard.  Pulmonary/Chest: Effort normal and breath sounds normal. No respiratory distress.  has no wheezes.  Neck = supple, no nuchal rigidity Abdominal: Soft. Bowel sounds are normal.  exhibits no distension. There is no tenderness.  Lymphadenopathy: no cervical adenopathy. No axillary adenopathy Neurological: alert and oriented to person, place, and time.  Skin: Skin is warm and dry. No rash noted. No erythema.  Psychiatric: a normal mood and affect.  behavior is normal.   Lab Results  Component Value Date   CD4TCELL 16 (L) 11/20/2014   Lab Results  Component Value Date   CD4TABS 310 (L) 11/20/2014   CD4TABS 430 03/11/2014   CD4TABS 610 12/19/2013   Lab Results  Component Value Date   HIV1RNAQUANT 230,241 (H) 11/20/2014   Lab Results  Component Value Date   HEPBSAB NEG 10/08/2013   Lab Results  Component Value Date   LABRPR NON REAC 11/20/2014    CBC Lab Results  Component Value Date   WBC 7.4 06/19/2016   RBC 4.94 06/19/2016   HGB 14.1 06/19/2016   HCT 41.1 06/19/2016   PLT 239 06/19/2016   MCV 83.2 06/19/2016   MCH 28.5 06/19/2016   MCHC 34.3 06/19/2016   RDW 13.6 06/19/2016   LYMPHSABS 3.1 06/10/2016   MONOABS 0.7 06/10/2016   EOSABS 0.1 06/10/2016    BMET Lab Results  Component Value Date   NA 140 06/19/2016   K 3.7 06/19/2016   CL 110 06/19/2016   CO2 17 (L) 06/19/2016   GLUCOSE 87 06/19/2016   BUN 14 06/19/2016   CREATININE 0.93 06/19/2016   CALCIUM 8.8 (L) 06/19/2016   GFRNONAA >60 06/19/2016   GFRAA >60 06/19/2016      Assessment and Plan  hiv disease = to restart with  descovy-prezcobix.labs with geno  oi proph = bactrim empirically  Health maintenance = will give meningococcal  Chronic hep c = will check hep c vl, hep b surface ab., afp, arrange RUQ U/S at next visit  rtc in 2 wk with cassie, 4-6 wk with me

## 2016-08-16 LAB — COMPLETE METABOLIC PANEL WITH GFR
ALBUMIN: 3.3 g/dL — AB (ref 3.6–5.1)
ALK PHOS: 65 U/L (ref 33–115)
ALT: 25 U/L (ref 6–29)
AST: 41 U/L — ABNORMAL HIGH (ref 10–35)
BUN: 9 mg/dL (ref 7–25)
CALCIUM: 8.1 mg/dL — AB (ref 8.6–10.2)
CHLORIDE: 111 mmol/L — AB (ref 98–110)
CO2: 22 mmol/L (ref 20–31)
CREATININE: 0.72 mg/dL (ref 0.50–1.10)
GFR, Est Non African American: >89 mL/min (ref >=60)
Glucose, Bld: 85 mg/dL (ref 65–99)
POTASSIUM: 4.2 mmol/L (ref 3.5–5.3)
Sodium: 142 mmol/L (ref 135–146)
Total Bilirubin: 0.5 mg/dL (ref 0.2–1.2)
Total Protein: 6.5 g/dL (ref 6.1–8.1)

## 2016-08-16 LAB — LIPID PANEL
CHOLESTEROL: 158 mg/dL (ref <200)
HDL: 49 mg/dL — ABNORMAL LOW (ref >50)
LDL Cholesterol: 95 mg/dL (ref <100)
Total CHOL/HDL Ratio: 3.2 Ratio (ref <5.0)
Triglycerides: 72 mg/dL (ref <150)
VLDL: 14 mg/dL (ref <30)

## 2016-08-16 LAB — HEPATITIS B SURFACE ANTIBODY,QUALITATIVE: HEP B S AB: NEGATIVE

## 2016-08-16 LAB — RPR

## 2016-08-16 LAB — AFP TUMOR MARKER: AFP TUMOR MARKER: 2.6 ng/mL (ref <6.1)

## 2016-08-17 LAB — HEPATITIS C RNA QUANTITATIVE
HCV QUANT: 47600 [IU]/mL — AB
HCV Quantitative Log: 4.68 Log IU/mL — ABNORMAL HIGH

## 2016-08-22 LAB — HIV-1 RNA,QN PCR W/REFLEX GENOTYPE
HIV-1 RNA, QN PCR: 5.54 Log cps/mL — ABNORMAL HIGH
HIV-1 RNA, QN PCR: 345000 {copies}/mL — AB

## 2016-08-22 LAB — HIV-1 GENOTYPR PLUS

## 2016-09-07 ENCOUNTER — Ambulatory Visit: Payer: Self-pay

## 2016-09-19 ENCOUNTER — Other Ambulatory Visit: Payer: Self-pay | Admitting: *Deleted

## 2016-09-19 ENCOUNTER — Telehealth: Payer: Self-pay | Admitting: *Deleted

## 2016-09-19 DIAGNOSIS — B20 Human immunodeficiency virus [HIV] disease: Secondary | ICD-10-CM

## 2016-09-19 MED ORDER — EMTRICITABINE-TENOFOVIR AF 200-25 MG PO TABS: 1.0000 | ORAL_TABLET | Freq: Every day | ORAL | 11 refills | Status: DC

## 2016-09-19 MED ORDER — DARUNAVIR-COBICISTAT 800-150 MG PO TABS: 1.0000 | ORAL_TABLET | Freq: Every day | ORAL | 11 refills | Status: DC

## 2016-09-19 NOTE — Telephone Encounter (Signed)
Call from [facility] with [facility] stating that patient needs an Rx for her hiv meds sent to their pharmacy. They asked how it would be paid for and we show pt has medicaid. She no longer has it and explained that she would need to sign up for ADAP. They are hoping to transport her to an inpatient program. Rx sent and they will find out if someone there can do ADAP. Wendall MolaJacqueline Rosana Farnell

## 2016-10-13 ENCOUNTER — Ambulatory Visit: Payer: Self-pay | Admitting: Internal Medicine

## 2016-10-16 ENCOUNTER — Encounter (HOSPITAL_COMMUNITY): Payer: Self-pay | Admitting: Emergency Medicine

## 2016-10-16 ENCOUNTER — Emergency Department (HOSPITAL_COMMUNITY)
Admission: EM | Admit: 2016-10-16 | Discharge: 2016-10-17 | Disposition: A | Discharge status: Home / Self Care | Payer: Medicaid Other | Attending: Emergency Medicine | Admitting: Emergency Medicine

## 2016-10-16 DIAGNOSIS — J029 Acute pharyngitis, unspecified: Secondary | ICD-10-CM | POA: Insufficient documentation

## 2016-10-16 DIAGNOSIS — R4589 Other symptoms and signs involving emotional state: Secondary | ICD-10-CM | POA: Diagnosis not present

## 2016-10-16 DIAGNOSIS — B20 Human immunodeficiency virus [HIV] disease: Secondary | ICD-10-CM | POA: Diagnosis not present

## 2016-10-16 DIAGNOSIS — Z79899 Other long term (current) drug therapy: Secondary | ICD-10-CM | POA: Diagnosis not present

## 2016-10-16 DIAGNOSIS — R45851 Suicidal ideations: Secondary | ICD-10-CM | POA: Diagnosis not present

## 2016-10-16 DIAGNOSIS — F17210 Nicotine dependence, cigarettes, uncomplicated: Secondary | ICD-10-CM | POA: Diagnosis not present

## 2016-10-16 LAB — RAPID STREP SCREEN (MED CTR MEBANE ONLY): Streptococcus, Group A Screen (Direct): NEGATIVE

## 2016-10-16 LAB — ACETAMINOPHEN LEVEL

## 2016-10-16 LAB — RAPID URINE DRUG SCREEN, HOSP PERFORMED
Amphetamines: NOT DETECTED
Barbiturates: NOT DETECTED
Benzodiazepines: NOT DETECTED
COCAINE: POSITIVE — AB
OPIATES: NOT DETECTED
TETRAHYDROCANNABINOL: POSITIVE — AB

## 2016-10-16 LAB — CBC
HCT: 39.4 % (ref 36.0–46.0)
HEMOGLOBIN: 12.5 g/dL (ref 12.0–15.0)
MCH: 26.9 pg (ref 26.0–34.0)
MCHC: 31.7 g/dL (ref 30.0–36.0)
MCV: 84.9 fL (ref 78.0–100.0)
Platelets: 236 10*3/uL (ref 150–400)
RBC: 4.64 MIL/uL (ref 3.87–5.11)
RDW: 16.1 % — ABNORMAL HIGH (ref 11.5–15.5)
WBC: 5.3 10*3/uL (ref 4.0–10.5)

## 2016-10-16 LAB — COMPREHENSIVE METABOLIC PANEL
ALT: 20 U/L (ref 14–54)
ANION GAP: 8 (ref 5–15)
AST: 34 U/L (ref 15–41)
Albumin: 3.6 g/dL (ref 3.5–5.0)
Alkaline Phosphatase: 88 U/L (ref 38–126)
BUN: 9 mg/dL (ref 6–20)
CHLORIDE: 112 mmol/L — AB (ref 101–111)
CO2: 23 mmol/L (ref 22–32)
Calcium: 8.9 mg/dL (ref 8.9–10.3)
Creatinine, Ser: 0.8 mg/dL (ref 0.44–1.00)
GFR calc non Af Amer: >60 mL/min (ref >60)
Glucose, Bld: 94 mg/dL (ref 65–99)
Potassium: 3.8 mmol/L (ref 3.5–5.1)
SODIUM: 143 mmol/L (ref 135–145)
Total Bilirubin: 0.5 mg/dL (ref 0.3–1.2)
Total Protein: 7.9 g/dL (ref 6.5–8.1)

## 2016-10-16 LAB — ETHANOL: Alcohol, Ethyl (B): 74 mg/dL — ABNORMAL HIGH (ref <5)

## 2016-10-16 LAB — I-STAT BETA HCG BLOOD, ED (MC, WL, AP ONLY): I-stat hCG, quantitative: <5 m[IU]/mL (ref <5)

## 2016-10-16 LAB — SALICYLATE LEVEL

## 2016-10-16 MED ORDER — CLOTRIMAZOLE 10 MG MT TROC: 10.0000 mg | Freq: Every day | OROMUCOSAL | 0 refills | Status: DC

## 2016-10-16 MED ORDER — CLOTRIMAZOLE 10 MG MT TROC
10.0000 mg | Freq: Every day | OROMUCOSAL | Status: DC
  Administered 2016-10-17: 10 mg via ORAL
  Filled 2016-10-16 (×4): qty 1

## 2016-10-16 NOTE — ED Notes (Signed)
Sent medication request to pharmacy

## 2016-10-16 NOTE — BH Assessment (Addendum)
Tele Assessment Note   Patient Name: Vanessa Cross MRN: [id number] Referring Physician: Buel ReamAlexandra Law Location of Patient: [facility] Location of Provider: Behavioral Health TTS Department  Vanessa Cross is an 46 y.o. female. Pt states she is suicidal with no plan. Pt denies HI and AVH. Pt states she is suicidal because she does not want to live on the streets alone. The Pt states she is homeless. The Pt reports daily crack cocaine use. The Pt reports previous inpatient treatment but does not recall when and where. The Pt [facility]. Pt states [facility] but lost it because she was late for her intake. The Pt states that she [facility] at [facility]:30[facility]. The Pt states she would like to remain in the hospital and D/C in am so that she can make it to her intake [facility]. Pt denies abuse.  Dr. Lolly MustacheArfeen recommends an am psych evaluation.   Diagnosis:  F33.1 MDD  Past Medical History:  Past Medical History:  Diagnosis Date  . HIV (human immunodeficiency virus infection) (HCC)     Past Surgical History:  Procedure Laterality Date  . ABDOMINAL HYSTERECTOMY  2008    Family History: No family history on file.  Social History:  reports that she has been smoking Cigarettes.  She has a 10.00 pack-year smoking history. She has never used smokeless tobacco. She reports that she drinks about 1.2 oz of alcohol per week . She reports that she uses drugs, including "Crack" cocaine, Cocaine, and Marijuana, about 7 times per week.  Additional Social History:  Alcohol / Drug Use Pain Medications: please see mar Prescriptions: please see mar Over the Counter: please see mar History of alcohol / drug use?: Yes Longest period of sobriety (when/how long): unknown Negative Consequences of Use: Financial, Legal, Personal relationships, Work / School Substance #1 Name of Substance 1: crack cocaine 1 - Age of First Use: unknown 1 -  Amount (size/oz): 1 gram 1 - Frequency: daily  1 - Duration: ongoing 1 - Last Use / Amount: 10/15/16  CIWA: CIWA-Ar BP: (!) 120/91 Pulse Rate: 76 COWS:    PATIENT STRENGTHS: (choose at least two) Average or above average intelligence Communication skills  Allergies: No Known Allergies  Home Medications:  (Not in a hospital admission)  OB/GYN Status:  No LMP recorded. Patient has had a hysterectomy.  General Assessment Data [facility] Assessment: In system Is this a Tele or Face-to-Face Assessment?: Tele Assessment Is this an Initial Assessment or a Re-assessment for this encounter?: Initial Assessment Marital status: Single Maiden name: NA Is patient pregnant?: No Pregnancy Status: No Living Arrangements: Other (Comment) (homeless) Can pt return to current living arrangement?: Yes Admission Status: Voluntary Is patient capable of signing voluntary admission?: Yes Referral Source: Self/Family/Friend Insurance type: Medicaid     Crisis Care Plan Living Arrangements: Other (Comment) (homeless) Legal Guardian: Other: (self) [facility] of [facility]ist: [facility]     Risk to self with the past 6 months Suicidal Ideation: Yes-Currently Present Has patient been a risk to self within the past 6 months prior to admission? : No Suicidal Intent: No Has patient had any suicidal intent within the past 6 months prior to admission? : No Is patient at risk for suicide?: Yes Suicidal Plan?: No Has patient had any suicidal plan within the past 6 months prior to admission? : No Access to Means: No What has  been your use of drugs/alcohol within the last 12 months?: crack cocaine Previous Attempts/Gestures: Yes How many times?: 2 Other Self Harm Risks: NA Triggers for Past Attempts: None known Intentional Self Injurious Behavior: None Family Suicide History: No Recent stressful life event(s): Other (Comment) (lonlieness) Persecutory  voices/beliefs?: No Depression: Yes Depression Symptoms: Tearfulness, Isolating, Loss of interest in usual pleasures, Feeling worthless/self pity, Feeling angry/irritable Substance abuse history and/or treatment for substance abuse?: Yes Suicide prevention information given to non-admitted patients: Not applicable  Risk to Others within the past 6 months Homicidal Ideation: No Does patient have any lifetime risk of violence toward others beyond the six months prior to admission? : No Thoughts of Harm to Others: No Current Homicidal Intent: No Current Homicidal Plan: No Access to Homicidal Means: No Identified Victim: NA History of harm to others?: No Assessment of Violence: None Noted Violent Behavior Description: NA Does patient have access to weapons?: No Criminal Charges Pending?: No Does patient have a court date: No Is patient on probation?: No  Psychosis Hallucinations: None noted Delusions: None noted  Mental Status Report Appearance/Hygiene: Disheveled Eye Contact: Fair Motor Activity: Freedom of movement Speech: Logical/coherent Level of Consciousness: Alert Mood: Depressed Affect: Depressed Anxiety Level: Minimal Thought Processes: Coherent, Relevant Judgement: Unimpaired Orientation: Person, Place, Time, Situation Obsessive Compulsive Thoughts/Behaviors: None  Cognitive Functioning Concentration: Normal Memory: Recent Intact, Remote Intact IQ: Average Insight: Fair Impulse Control: Fair Appetite: Fair Weight Loss: 0 Weight Gain: 0 Sleep: Decreased Total Hours of Sleep: 5 Vegetative Symptoms: None  ADLScreening [facility] Assessment Services) Patient's cognitive ability adequate to safely complete daily activities?: Yes Patient able to express need for assistance with ADLs?: Yes Independently performs ADLs?: Yes (appropriate for developmental age)  Prior Inpatient Therapy Prior Inpatient Therapy: Yes Prior Therapy Dates: unknown Prior Therapy  Facilty/Provider(s): unknown Reason for Treatment: unknown  Prior Outpatient Therapy Prior Outpatient Therapy: Yes Prior Therapy Dates: current Prior Therapy Facilty/Provider(s): [facility] Reason for Treatment: depression Does patient have an ACCT team?: No Does patient have Intensive In-House Services?  : No Does patient have [facility] services? : No Does patient have P4CC services?: No  ADL Screening (condition at time of admission) Patient's cognitive ability adequate to safely complete daily activities?: Yes Is the patient deaf or have difficulty hearing?: No Does the patient have difficulty seeing, even when wearing glasses/contacts?: No Does the patient have difficulty concentrating, remembering, or making decisions?: No Patient able to express need for assistance with ADLs?: Yes Does the patient have difficulty dressing or bathing?: No Independently performs ADLs?: Yes (appropriate for developmental age) Does the patient have difficulty walking or climbing stairs?: No Weakness of Legs: None Weakness of Arms/Hands: None       Abuse/Neglect Assessment (Assessment to be complete while patient is alone) Physical Abuse: Denies Verbal Abuse: Denies Sexual Abuse: Denies Exploitation of patient/patient's resources: Denies Self-Neglect: Denies          Additional Information 1:1 In Past 12 Months?: No CIRT Risk: No Elopement Risk: No Does patient have medical clearance?: Yes     Disposition:  Disposition Initial Assessment Completed for this Encounter: Yes Disposition of Patient: Other dispositions (am psych)  This service was provided via telemedicine using a 2-way, interactive audio and video technology.  Names of all persons participating in this telemedicine service and their role in this encounter. Name: Dr. Kathryne Sharper Role: Physician  Name:  Role:   Name:  Role:   Name:  Role:     Emmit Pomfret 10/16/2016 6:24 PM

## 2016-10-16 NOTE — ED Notes (Signed)
Staffing aware of pt need for sitter. Blood being collected, pt given paper scrubs to change into.

## 2016-10-16 NOTE — ED Provider Notes (Signed)
[facility] DEPT Provider Note   CSN: [id number] Arrival date & time: 10/16/16  1652     History   Chief Complaint Chief Complaint  Patient presents with  . Suicidal    HPI Vanessa Cross is a 46 y.o. female with history of HIV, hepatitis C who presents with suicidal ideation. Patient reports that she has had thoughts of hurting herself, not killing herself, for the past few days. She has not taken her medications in months, because they make her hallucinate. Patient does not have a plan to hurt herself. She denies any current hallucinations. She denies any homicidal ideations. Patient notes that she has had a sore throat for the past few days, and feeling of hot and cold for the past several weeks. Otherwise, she denies any chest pain, shortness of breath, abdominal pain, nausea, vomiting, urinary symptoms, or other complaints. Patient notes to smoking crack yesterday and drinking a beer today. She denies any other drug use.  HPI  Past Medical History:  Diagnosis Date  . HIV (human immunodeficiency virus infection) [facility])     Patient Active Problem List   Diagnosis Date Noted  . Alcohol abuse with alcohol-induced mood disorder (HCC) 06/20/2016  . 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 Tobacco use disorder 10/11/2013  . 2012 10/11/2013  . Genital herpes 10/11/2013  . History of trichomoniasis 10/11/2013  . History of gonorrhea 10/11/2013    Past Surgical History:  Procedure Laterality Date  . ABDOMINAL HYSTERECTOMY  2008    OB History    No data available       Home Medications    Prior to Admission medications   Medication Sig Start Date End Date Taking? Authorizing Provider  albuterol (PROVENTIL HFA;VENTOLIN HFA) 108 (90 Base) MCG/ACT inhaler Inhale 2 puffs into the lungs every 6 (six) hours as needed for wheezing or shortness of breath. 08/15/16  Yes Judyann Munson, MD  citalopram (CELEXA) 10 MG tablet Take 10 mg by mouth daily.   Yes  [provider]  darunavir-cobicistat (PREZCOBIX) 800-150 MG tablet Take 1 tablet by mouth daily with breakfast. Swallow whole. Do NOT crush, break or chew tablets. Take with food. 09/19/16  Yes Ginnie Smart, MD  emtricitabine-tenofovir AF (DESCOVY) 200-25 MG tablet Take 1 tablet by mouth daily. 09/19/16  Yes Ginnie Smart, MD  QUEtiapine (SEROQUEL) 100 MG tablet Take 100 mg by mouth at bedtime.   Yes [provider]  sulfamethoxazole-trimethoprim (BACTRIM DS,SEPTRA DS) 800-160 MG tablet Take 1 tablet by mouth daily. 08/15/16  Yes Judyann Munson, MD  valACYclovir (VALTREX) 1000 MG tablet Take 1 tablet (1,000 mg total) by mouth daily. 08/15/16  Yes Judyann Munson, MD  clotrimazole (MYCELEX) 10 MG troche Take 1 tablet (10 mg total) by mouth 5 (five) times daily. 10/16/16   Noelle Sease, Waylan Boga, PA-C  fluconazole (DIFLUCAN) 100 MG tablet Take 1 tablet (100 mg total) by mouth every 3 (three) days. X 4 doses 08/15/16   Judyann Munson, MD    Family History No family history on file.  Social History Social History  Substance Use Topics  . Smoking status: Current Every Day Smoker    Packs/day: 0.50    Years: 20.00    Types: Cigarettes  . Smokeless tobacco: Never Used  . Alcohol use 1.2 oz/week    2 Standard drinks or equivalent per week     Allergies   Patient has no known allergies.   Review of Systems Review of Systems  Constitutional: Negative  for chills and fever.  HENT: Positive for sore throat. Negative for facial swelling.   Respiratory: Negative for shortness of breath.   Cardiovascular: Negative for chest pain.  Gastrointestinal: Negative for abdominal pain, nausea and vomiting.  Genitourinary: Negative for dysuria.  Musculoskeletal: Negative for back pain.  Skin: Negative for rash and wound.  Neurological: Negative for headaches.  Psychiatric/Behavioral: Positive for self-injury (last 2 weeks ago). Negative for hallucinations. The patient is not  nervous/anxious.      Physical Exam Updated Vital Signs BP (!) 120/91   Pulse 76   Temp 98.3 F (36.8 C)   Resp 18   SpO2 100%   Physical Exam  Constitutional: She appears well-developed and well-nourished. No distress.  HENT:  Head: Normocephalic and atraumatic.  Mouth/Throat: No trismus in the jaw. No uvula swelling. Posterior oropharyngeal erythema present. No oropharyngeal exudate or tonsillar abscesses. Tonsillar exudate.  Spots of yellow on tongue, not painful, not scraped off with tongue depressor  Eyes: Pupils are equal, round, and reactive to light. Conjunctivae are normal. Right eye exhibits no discharge. Left eye exhibits no discharge. No scleral icterus.  Neck: Normal range of motion. Neck supple. No thyromegaly present.  Cardiovascular: Normal rate, regular rhythm, normal heart sounds and intact distal pulses.  Exam reveals no gallop and no friction rub.   No murmur heard. Pulmonary/Chest: Effort normal and breath sounds normal. No stridor. No respiratory distress. She has no wheezes. She has no rales.  Abdominal: Soft. Bowel sounds are normal. She exhibits no distension. There is no tenderness. There is no rebound and no guarding.  Musculoskeletal: She exhibits no edema.  Lymphadenopathy:    She has no cervical adenopathy.  Neurological: She is alert. Coordination normal.  Skin: Skin is warm and dry. No rash noted. She is not diaphoretic. No pallor.  Psychiatric: She has a normal mood and affect.  Nursing note and vitals reviewed.    ED Treatments / Results  Labs (all labs ordered are listed, but only abnormal results are displayed) Labs Reviewed  COMPREHENSIVE METABOLIC PANEL - Abnormal; Notable for the following:       Result Value   Chloride 112 (*)    All other components within normal limits  ETHANOL - Abnormal; Notable for the following:    Alcohol, Ethyl (B) 74 (*)    All other components within normal limits  ACETAMINOPHEN LEVEL - Abnormal; Notable  for the following:    Acetaminophen (Tylenol), Serum <10 (*)    All other components within normal limits  CBC - Abnormal; Notable for the following:    RDW 16.1 (*)    All other components within normal limits  RAPID URINE DRUG SCREEN, HOSP PERFORMED - Abnormal; Notable for the following:    Cocaine POSITIVE (*)    Tetrahydrocannabinol POSITIVE (*)    All other components within normal limits  RAPID STREP SCREEN (NOT AT [facility])  CULTURE, GROUP A STREP ([facility])  SALICYLATE LEVEL  I-STAT BETA HCG BLOOD, ED ([facility], [facility], [facility] ONLY)    EKG  EKG Interpretation None       Radiology No results found.  Procedures Procedures (including critical care time)  Medications Ordered in ED Medications  clotrimazole (MYCELEX) troche 10 mg (not administered)     Initial Impression / Assessment and Plan / ED Course  I have reviewed the triage vital signs and the nursing notes.  Pertinent labs & imaging results that were available during my care of the patient were reviewed by me and  considered in my medical decision making (see chart for details).     CBC, CMP unremarkable. Tylenol and salicylate levels negative. Ethanol level 74. UDS shows positive cocaine and THC. Pregnancy negative. Rapid strep negative. If negative, will treat with clotrimazole troche 5x/day x 7 days for candidiasis, considering HIV status. TTS recommending AM psych eval. [facility] at [facility]:30am (10/18/2016). Myself and Dr. Rhunette CroftNanavati discussed with the patient that [facility] and feels that this would be very helpful for her. She reports she keeps missing her appointment time and may give her bed away. We will discharge her before a.m. psych eval around 5 AM so [facility]. Patient agrees that this will help her with her thoughts of hurting herself. She agrees to come back if anything comes worse.  Final Clinical Impressions(s) / ED Diagnoses   Final  diagnoses:  Sore throat  Thoughts of self harm    New Prescriptions New Prescriptions   CLOTRIMAZOLE (MYCELEX) 10 MG TROCHE    Take 1 tablet (10 mg total) by mouth 5 (five) times daily.           Emi HolesLaw, Kenslie Abbruzzese M, PA-C 10/17/16 0124    Derwood KaplanNanavati, Ankit, MD 10/20/16 [phone]

## 2016-10-16 NOTE — ED Notes (Signed)
TTS at bedside. 

## 2016-10-16 NOTE — ED Triage Notes (Addendum)
Pt reports she has been off of her medications for several months. Reports several days of suicidal ideation, no specific plan. States she doesn't want to be alone tonight. Pt is homeless, living at shelter but put on the streets. PT "smokes crack," last use yesterday.

## 2016-10-17 NOTE — ED Notes (Signed)
Pt showering and grooming

## 2016-10-17 NOTE — Discharge Instructions (Signed)
[facility].

## 2016-10-17 NOTE — ED Notes (Signed)
Rhunette CroftNanavati, MD and Trinna PostAlex, [place] at bedside

## 2016-10-19 LAB — CULTURE, GROUP A STREP (THRC)

## 2017-01-08 ENCOUNTER — Emergency Department (HOSPITAL_COMMUNITY): Payer: Medicaid Other

## 2017-01-08 ENCOUNTER — Other Ambulatory Visit: Payer: Self-pay

## 2017-01-08 ENCOUNTER — Emergency Department (HOSPITAL_COMMUNITY)
Admission: EM | Admit: 2017-01-08 | Discharge: 2017-01-09 | Disposition: A | Discharge status: Other Acute Inpatient Hospital | Payer: Medicaid Other | Attending: Emergency Medicine | Admitting: Emergency Medicine

## 2017-01-08 ENCOUNTER — Encounter (HOSPITAL_COMMUNITY): Payer: Self-pay | Admitting: *Deleted

## 2017-01-08 DIAGNOSIS — B20 Human immunodeficiency virus [HIV] disease: Secondary | ICD-10-CM | POA: Insufficient documentation

## 2017-01-08 DIAGNOSIS — S0990XA Unspecified injury of head, initial encounter: Secondary | ICD-10-CM | POA: Diagnosis present

## 2017-01-08 DIAGNOSIS — F329 Major depressive disorder, single episode, unspecified: Secondary | ICD-10-CM | POA: Diagnosis not present

## 2017-01-08 DIAGNOSIS — Y9289 Other specified places as the place of occurrence of the external cause: Secondary | ICD-10-CM | POA: Insufficient documentation

## 2017-01-08 DIAGNOSIS — F17210 Nicotine dependence, cigarettes, uncomplicated: Secondary | ICD-10-CM | POA: Diagnosis not present

## 2017-01-08 DIAGNOSIS — F1210 Cannabis abuse, uncomplicated: Secondary | ICD-10-CM | POA: Insufficient documentation

## 2017-01-08 DIAGNOSIS — R45851 Suicidal ideations: Secondary | ICD-10-CM

## 2017-01-08 DIAGNOSIS — Y939 Activity, unspecified: Secondary | ICD-10-CM | POA: Diagnosis not present

## 2017-01-08 DIAGNOSIS — Y999 Unspecified external cause status: Secondary | ICD-10-CM | POA: Diagnosis not present

## 2017-01-08 DIAGNOSIS — F1410 Cocaine abuse, uncomplicated: Secondary | ICD-10-CM | POA: Diagnosis not present

## 2017-01-08 DIAGNOSIS — S02609A Fracture of mandible, unspecified, initial encounter for closed fracture: Secondary | ICD-10-CM | POA: Diagnosis not present

## 2017-01-08 DIAGNOSIS — Z79899 Other long term (current) drug therapy: Secondary | ICD-10-CM | POA: Diagnosis not present

## 2017-01-08 LAB — COMPREHENSIVE METABOLIC PANEL
ALT: 18 U/L (ref 14–54)
AST: 42 U/L — AB (ref 15–41)
Albumin: 3.2 g/dL — ABNORMAL LOW (ref 3.5–5.0)
Alkaline Phosphatase: 93 U/L (ref 38–126)
Anion gap: 10 (ref 5–15)
BILIRUBIN TOTAL: 1 mg/dL (ref 0.3–1.2)
BUN: 13 mg/dL (ref 6–20)
CO2: 20 mmol/L — ABNORMAL LOW (ref 22–32)
CREATININE: 1.01 mg/dL — AB (ref 0.44–1.00)
Calcium: 8.4 mg/dL — ABNORMAL LOW (ref 8.9–10.3)
Chloride: 109 mmol/L (ref 101–111)
GFR calc Af Amer: >60 mL/min (ref >60)
Glucose, Bld: 89 mg/dL (ref 65–99)
Potassium: 3.8 mmol/L (ref 3.5–5.1)
Sodium: 139 mmol/L (ref 135–145)
TOTAL PROTEIN: 7.5 g/dL (ref 6.5–8.1)

## 2017-01-08 LAB — ACETAMINOPHEN LEVEL: Acetaminophen (Tylenol), Serum: 11 ug/mL (ref 10–30)

## 2017-01-08 LAB — URINALYSIS, ROUTINE W REFLEX MICROSCOPIC
Bilirubin Urine: NEGATIVE
GLUCOSE, UA: NEGATIVE mg/dL
KETONES UR: NEGATIVE mg/dL
Nitrite: NEGATIVE
PROTEIN: 100 mg/dL — AB
Specific Gravity, Urine: 1.021 (ref 1.005–1.030)
pH: 6 (ref 5.0–8.0)

## 2017-01-08 LAB — CBC WITH DIFFERENTIAL/PLATELET
Basophils Absolute: 0 10*3/uL (ref 0.0–0.1)
Basophils Relative: 0 %
EOS PCT: 1 %
Eosinophils Absolute: 0.1 10*3/uL (ref 0.0–0.7)
HCT: 37.2 % (ref 36.0–46.0)
Hemoglobin: 12.7 g/dL (ref 12.0–15.0)
LYMPHS ABS: 1.5 10*3/uL (ref 0.7–4.0)
LYMPHS PCT: 19 %
MCH: 29.4 pg (ref 26.0–34.0)
MCHC: 34.1 g/dL (ref 30.0–36.0)
MCV: 86.1 fL (ref 78.0–100.0)
MONO ABS: 0.7 10*3/uL (ref 0.1–1.0)
MONOS PCT: 8 %
Neutro Abs: 5.6 10*3/uL (ref 1.7–7.7)
Neutrophils Relative %: 72 %
PLATELETS: 218 10*3/uL (ref 150–400)
RBC: 4.32 MIL/uL (ref 3.87–5.11)
RDW: 14.1 % (ref 11.5–15.5)
WBC: 7.9 10*3/uL (ref 4.0–10.5)

## 2017-01-08 LAB — SALICYLATE LEVEL: Salicylate Lvl: <7 mg/dL (ref 2.8–30.0)

## 2017-01-08 LAB — TROPONIN I
Troponin I: <0.03 ng/mL (ref <0.03)
Troponin I: <0.03 ng/mL (ref <0.03)

## 2017-01-08 LAB — PREGNANCY, URINE: PREG TEST UR: NEGATIVE

## 2017-01-08 LAB — RAPID URINE DRUG SCREEN, HOSP PERFORMED
AMPHETAMINES: NOT DETECTED
Barbiturates: NOT DETECTED
Benzodiazepines: NOT DETECTED
Cocaine: POSITIVE — AB
Opiates: NOT DETECTED
Tetrahydrocannabinol: POSITIVE — AB

## 2017-01-08 LAB — ETHANOL

## 2017-01-08 MED ORDER — ALBUTEROL SULFATE HFA 108 (90 BASE) MCG/ACT IN AERS: 2.0000 | INHALATION_SPRAY | Freq: Four times a day (QID) | RESPIRATORY_TRACT | Status: DC | PRN

## 2017-01-08 MED ORDER — DEXTROSE 5 % IV SOLN
1.0000 g | Freq: Once | INTRAVENOUS | Status: AC
  Administered 2017-01-08: 1 g via INTRAVENOUS
  Filled 2017-01-08: qty 10

## 2017-01-08 MED ORDER — CLOTRIMAZOLE 10 MG MT TROC
10.0000 mg | Freq: Every day | OROMUCOSAL | Status: DC
  Filled 2017-01-08 (×3): qty 1

## 2017-01-08 MED ORDER — DARUNAVIR-COBICISTAT 800-150 MG PO TABS
1.0000 | ORAL_TABLET | Freq: Every day | ORAL | Status: DC
  Administered 2017-01-08: 1 via ORAL
  Filled 2017-01-08 (×2): qty 1

## 2017-01-08 MED ORDER — VALACYCLOVIR HCL 500 MG PO TABS
1000.0000 mg | ORAL_TABLET | Freq: Every day | ORAL | Status: DC
  Filled 2017-01-08: qty 2

## 2017-01-08 MED ORDER — CITALOPRAM HYDROBROMIDE 10 MG PO TABS
10.0000 mg | ORAL_TABLET | Freq: Every day | ORAL | Status: DC
  Administered 2017-01-08 – 2017-01-09 (×2): 10 mg via ORAL
  Filled 2017-01-08 (×2): qty 1

## 2017-01-08 MED ORDER — ACETAMINOPHEN 500 MG PO TABS
1000.0000 mg | ORAL_TABLET | Freq: Four times a day (QID) | ORAL | Status: DC | PRN
  Administered 2017-01-08 – 2017-01-09 (×2): 1000 mg via ORAL
  Filled 2017-01-08 (×2): qty 2

## 2017-01-08 MED ORDER — CEPHALEXIN 250 MG PO CAPS
500.0000 mg | ORAL_CAPSULE | Freq: Two times a day (BID) | ORAL | Status: DC
  Administered 2017-01-08 – 2017-01-09 (×2): 500 mg via ORAL
  Filled 2017-01-08 (×2): qty 2

## 2017-01-08 MED ORDER — ACETAMINOPHEN 500 MG PO TABS
500.0000 mg | ORAL_TABLET | ORAL | Status: DC | PRN
  Filled 2017-01-08: qty 1

## 2017-01-08 MED ORDER — EMTRICITABINE-TENOFOVIR AF 200-25 MG PO TABS
1.0000 | ORAL_TABLET | Freq: Every day | ORAL | Status: DC
  Administered 2017-01-08 – 2017-01-09 (×2): 1 via ORAL
  Filled 2017-01-08 (×2): qty 1

## 2017-01-08 MED ORDER — SULFAMETHOXAZOLE-TRIMETHOPRIM 800-160 MG PO TABS
1.0000 | ORAL_TABLET | Freq: Every day | ORAL | Status: DC
  Filled 2017-01-08: qty 1

## 2017-01-08 MED ORDER — MORPHINE SULFATE (PF) 4 MG/ML IV SOLN
4.0000 mg | Freq: Once | INTRAVENOUS | Status: AC
  Administered 2017-01-08: 4 mg via INTRAVENOUS
  Filled 2017-01-08: qty 1

## 2017-01-08 MED ORDER — QUETIAPINE FUMARATE 100 MG PO TABS
100.0000 mg | ORAL_TABLET | Freq: Every day | ORAL | Status: DC
  Administered 2017-01-08: 100 mg via ORAL
  Filled 2017-01-08 (×3): qty 1

## 2017-01-08 MED ORDER — FLUCONAZOLE 100 MG PO TABS
100.0000 mg | ORAL_TABLET | ORAL | Status: DC
  Filled 2017-01-08: qty 1

## 2017-01-08 NOTE — ED Notes (Signed)
Ordered soft diet dinner tray.

## 2017-01-08 NOTE — ED Notes (Signed)
Pt reports she has not been on any medications in 3 months.  Last seen Inf disease in July, 2018.  Discussed with Dr. Fredderick PhenixBelfi medications that were renewed by Dr. Rodena MedinMessick.  Dr. Fredderick PhenixBelfi made adjustments with orders.
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
## 2017-01-08 NOTE — ED Notes (Signed)
[facility] recommending inpatient per Reather LaurenceEugene Naughton.
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
## 2017-01-08 NOTE — ED Notes (Signed)
ED Provider at bedside. 

## 2017-01-08 NOTE — ED Notes (Addendum)
Pt eating lunch @ bedside.
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
## 2017-01-08 NOTE — ED Notes (Signed)
TTS being done now 

## 2017-01-08 NOTE — ED Notes (Signed)
[facility].  Call report in the am [phone].  [facility] Ru[facility]ather [facility].

## 2017-01-08 NOTE — ED Provider Notes (Signed)
Patient was seen earlier by Dr. Rodena MedinMessick for suicidal ideations.  She has been accepted by behavioral health.  She was noted to develop a fever of 102.  She denies any recent illnesses or complaints of symptoms other than she is having some burning on urination.  There is no cough or chest congestion.  Her chest x-ray is clear without pneumonia.  She denies any runny nose or nasal congestion.  No wounds or signs of cellulitis.  Her urine does look infected and she was started on antibiotics for this.  A urine culture was sent.  She is otherwise well-appearing.  She was given Tylenol for symptomatic relief.   Vanessa Cross, Vanessa Boies, MD 01/08/17 2229
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
## 2017-01-08 NOTE — ED Notes (Signed)
Called pharmacy for seroquel

## 2017-01-08 NOTE — Progress Notes (Signed)
Per Leighton Ruffina Okonkwo NP, pt meets criteria for inpatient treatment. Per [place], [facility], [facility]. Accepting provider is Dr. Nehemiah MassedFernando Cobos MD. Patient can arrive after 8:00AM. Number for report is [phone]. CSW notified pt's RN: Farrel DemarkCarla Price at [facility] of [facility].   Trula SladeHeather Smart, MSW, LCSW Clinical Social Worker 01/08/2017 3:51 PM

## 2017-01-08 NOTE — ED Notes (Signed)
Attempted blood draw. Unsuccessful 

## 2017-01-09 ENCOUNTER — Other Ambulatory Visit: Payer: Self-pay

## 2017-01-09 ENCOUNTER — Encounter (HOSPITAL_COMMUNITY): Payer: Self-pay

## 2017-01-09 ENCOUNTER — Inpatient Hospital Stay (HOSPITAL_COMMUNITY)
Admission: AD | Admit: 2017-01-09 | Discharge: 2017-01-13 | DRG: 885 | Disposition: A | Discharge status: Rehabilitation Facility | Payer: Medicaid Other | Source: Intra-hospital | Attending: Psychiatry | Admitting: Psychiatry

## 2017-01-09 DIAGNOSIS — F333 Major depressive disorder, recurrent, severe with psychotic symptoms: Secondary | ICD-10-CM | POA: Diagnosis not present

## 2017-01-09 DIAGNOSIS — A6000 Herpesviral infection of urogenital system, unspecified: Secondary | ICD-10-CM | POA: Diagnosis present

## 2017-01-09 DIAGNOSIS — Z79899 Other long term (current) drug therapy: Secondary | ICD-10-CM | POA: Diagnosis not present

## 2017-01-09 DIAGNOSIS — R45851 Suicidal ideations: Secondary | ICD-10-CM | POA: Diagnosis present

## 2017-01-09 DIAGNOSIS — Z21 Asymptomatic human immunodeficiency virus [HIV] infection status: Secondary | ICD-10-CM | POA: Diagnosis present

## 2017-01-09 DIAGNOSIS — Z590 Homelessness: Secondary | ICD-10-CM | POA: Diagnosis not present

## 2017-01-09 DIAGNOSIS — F419 Anxiety disorder, unspecified: Secondary | ICD-10-CM | POA: Diagnosis present

## 2017-01-09 DIAGNOSIS — B20 Human immunodeficiency virus [HIV] disease: Secondary | ICD-10-CM

## 2017-01-09 DIAGNOSIS — F17210 Nicotine dependence, cigarettes, uncomplicated: Secondary | ICD-10-CM | POA: Diagnosis not present

## 2017-01-09 DIAGNOSIS — F1410 Cocaine abuse, uncomplicated: Secondary | ICD-10-CM | POA: Diagnosis not present

## 2017-01-09 DIAGNOSIS — Z716 Tobacco abuse counseling: Secondary | ICD-10-CM | POA: Diagnosis not present

## 2017-01-09 DIAGNOSIS — Z23 Encounter for immunization: Secondary | ICD-10-CM | POA: Diagnosis not present

## 2017-01-09 DIAGNOSIS — Z720 Tobacco use: Secondary | ICD-10-CM

## 2017-01-09 DIAGNOSIS — N390 Urinary tract infection, site not specified: Secondary | ICD-10-CM | POA: Diagnosis present

## 2017-01-09 DIAGNOSIS — F1010 Alcohol abuse, uncomplicated: Secondary | ICD-10-CM | POA: Diagnosis present

## 2017-01-09 DIAGNOSIS — B1920 Unspecified viral hepatitis C without hepatic coma: Secondary | ICD-10-CM | POA: Diagnosis present

## 2017-01-09 DIAGNOSIS — F329 Major depressive disorder, single episode, unspecified: Secondary | ICD-10-CM | POA: Diagnosis present

## 2017-01-09 DIAGNOSIS — F39 Unspecified mood [affective] disorder: Secondary | ICD-10-CM | POA: Diagnosis not present

## 2017-01-09 DIAGNOSIS — Z818 Family history of other mental and behavioral disorders: Secondary | ICD-10-CM | POA: Diagnosis not present

## 2017-01-09 DIAGNOSIS — F1994 Other psychoactive substance use, unspecified with psychoactive substance-induced mood disorder: Secondary | ICD-10-CM | POA: Diagnosis present

## 2017-01-09 DIAGNOSIS — R450 Nervousness: Secondary | ICD-10-CM | POA: Diagnosis not present

## 2017-01-09 DIAGNOSIS — G4700 Insomnia, unspecified: Secondary | ICD-10-CM | POA: Diagnosis present

## 2017-01-09 DIAGNOSIS — Z90710 Acquired absence of both cervix and uterus: Secondary | ICD-10-CM

## 2017-01-09 DIAGNOSIS — Z8619 Personal history of other infectious and parasitic diseases: Secondary | ICD-10-CM | POA: Diagnosis not present

## 2017-01-09 DIAGNOSIS — F319 Bipolar disorder, unspecified: Principal | ICD-10-CM | POA: Diagnosis present

## 2017-01-09 DIAGNOSIS — F1210 Cannabis abuse, uncomplicated: Secondary | ICD-10-CM | POA: Diagnosis not present

## 2017-01-09 DIAGNOSIS — S02609A Fracture of mandible, unspecified, initial encounter for closed fracture: Secondary | ICD-10-CM | POA: Diagnosis not present

## 2017-01-09 DIAGNOSIS — R0602 Shortness of breath: Secondary | ICD-10-CM | POA: Diagnosis not present

## 2017-01-09 DIAGNOSIS — R440 Auditory hallucinations: Secondary | ICD-10-CM | POA: Diagnosis not present

## 2017-01-09 LAB — INFLUENZA PANEL BY PCR (TYPE A & B)
INFLAPCR: NEGATIVE
INFLBPCR: NEGATIVE

## 2017-01-09 LAB — I-STAT CG4 LACTIC ACID, ED: Lactic Acid, Venous: 1.05 mmol/L (ref 0.5–1.9)

## 2017-01-09 MED ORDER — ALUM & MAG HYDROXIDE-SIMETH 200-200-20 MG/5ML PO SUSP: 30.0000 mL | ORAL | Status: DC | PRN

## 2017-01-09 MED ORDER — CITALOPRAM HYDROBROMIDE 10 MG PO TABS
10.0000 mg | ORAL_TABLET | Freq: Every day | ORAL | Status: DC
  Administered 2017-01-09 – 2017-01-13 (×5): 10 mg via ORAL
  Filled 2017-01-09 (×2): qty 1
  Filled 2017-01-09: qty 30
  Filled 2017-01-09 (×5): qty 1

## 2017-01-09 MED ORDER — QUETIAPINE FUMARATE 100 MG PO TABS
100.0000 mg | ORAL_TABLET | Freq: Every day | ORAL | Status: DC
Start: 2017-01-09 — End: 2017-01-13
  Administered 2017-01-09 – 2017-01-12 (×3): 100 mg via ORAL
  Filled 2017-01-09 (×4): qty 1
  Filled 2017-01-09: qty 30
  Filled 2017-01-09 (×2): qty 1

## 2017-01-09 MED ORDER — ACETAMINOPHEN 325 MG PO TABS
650.0000 mg | ORAL_TABLET | Freq: Four times a day (QID) | ORAL | Status: DC | PRN
  Administered 2017-01-10 – 2017-01-11 (×2): 650 mg via ORAL
  Filled 2017-01-09 (×2): qty 2

## 2017-01-09 MED ORDER — MAGNESIUM HYDROXIDE 400 MG/5ML PO SUSP: 30.0000 mL | Freq: Every day | ORAL | Status: DC | PRN

## 2017-01-09 MED ORDER — EMTRICITABINE-TENOFOVIR AF 200-25 MG PO TABS
1.0000 | ORAL_TABLET | Freq: Every day | ORAL | Status: DC
  Administered 2017-01-10 – 2017-01-13 (×4): 1 via ORAL
  Filled 2017-01-09 (×6): qty 1

## 2017-01-09 MED ORDER — INFLUENZA VAC SPLIT QUAD 0.5 ML IM SUSY
0.5000 mL | PREFILLED_SYRINGE | INTRAMUSCULAR | Status: AC
  Administered 2017-01-10: 0.5 mL via INTRAMUSCULAR
  Filled 2017-01-09: qty 0.5

## 2017-01-09 MED ORDER — DARUNAVIR-COBICISTAT 800-150 MG PO TABS
1.0000 | ORAL_TABLET | Freq: Every day | ORAL | Status: DC
  Administered 2017-01-10 – 2017-01-13 (×4): 1 via ORAL
  Filled 2017-01-09 (×6): qty 1

## 2017-01-09 MED ORDER — IBUPROFEN 400 MG PO TABS
600.0000 mg | ORAL_TABLET | Freq: Once | ORAL | Status: AC
  Administered 2017-01-09: 600 mg via ORAL
  Filled 2017-01-09: qty 1

## 2017-01-09 MED ORDER — TRAZODONE HCL 50 MG PO TABS
50.0000 mg | ORAL_TABLET | Freq: Every evening | ORAL | Status: DC | PRN
  Filled 2017-01-09: qty 30
  Filled 2017-01-09: qty 1

## 2017-01-09 MED ORDER — SODIUM CHLORIDE 0.9 % IV BOLUS (SEPSIS)
1000.0000 mL | Freq: Once | INTRAVENOUS | Status: AC
  Administered 2017-01-09: 1000 mL via INTRAVENOUS

## 2017-01-09 MED ORDER — HYDROXYZINE HCL 25 MG PO TABS
25.0000 mg | ORAL_TABLET | Freq: Three times a day (TID) | ORAL | Status: DC | PRN
  Filled 2017-01-09: qty 30
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
## 2017-01-09 NOTE — Tx Team (Signed)
Initial Treatment Plan 01/09/2017 7:39 PM Vanessa Sangerina Lasure UJW:[id number]    PATIENT STRESSORS: Financial difficulties Health problems Other: Homeless   PATIENT STRENGTHS: Capable of independent living General fund of knowledge Motivation for treatment/growth   PATIENT IDENTIFIED PROBLEMS: Depression  Suicidal ideation  Substance abuse  "I would like some long term substance abuse treatment, 28 day or two week, what ever is available"  "Get back on my medications" (medical and psych)             DISCHARGE CRITERIA:  Improved stabilization in mood, thinking, and/or behavior Need for constant or close observation no longer present Verbal commitment to aftercare and medication compliance  PRELIMINARY DISCHARGE PLAN: Outpatient therapy Medication management, hoping for long term treatement  PATIENT/FAMILY INVOLVEMENT: This treatment plan has been presented to and reviewed with the patient, Vanessa Cross.  The patient and family have been given the opportunity to ask questions and make suggestions.  Levin BaconHeather V Linken Mcglothen, RN 01/09/2017, 7:39 PM

## 2017-01-09 NOTE — Progress Notes (Addendum)
Patient ID: Vanessa Cross, female   DOB: 06/17/1970, 46 y.o.   MRN: [id number]  Pt currently presents with a flat affect and depressed behavior. Pt remains in bed during the shift tonight. Pt states "I'm okay, just tired." Per admission nurse after chart review, pt was treated with one dose of antibiotics for a UTI PTA. Pt asks only for sleep medication and "nabs.. Like crackers." Pt reports good sleep with current medication regimen.   Pt provided with medications per providers orders. Pt's labs and vitals were monitored throughout the night. Pt given a 1:1 about emotional and mental status. Pt supported and encouraged to express concerns and questions. Pt educated on medications. Pt given snack and encouraged fluids. Provider notified of patients antibiotic regimen, see MAR.   Pt's safety ensured with 15 minute and environmental checks. Pt currently denies SI/HI and A/V hallucinations. Pt verbally agrees to seek staff if SI/HI or A/VH occurs and to consult with staff before acting on any harmful thoughts. Will continue POC.

## 2017-01-09 NOTE — ED Notes (Addendum)
Pt was handed off from [employer] and told Seward SpeckRn Zamier Eggebrecht that pt was not being excepted from behavioral health until IV antibiotics and temp was down and vitals are stable. Was told that behavioral health has held bed until further evaluation .Temp is now 100 from 102.  Patients pressure are soft and advised MD and was advised to hang fluids and lactic to be drawn. Advised MD that pressure are still soft.

## 2017-01-09 NOTE — ED Provider Notes (Signed)
Patient awaiting behavioral health transfer.  She was reevaluated by Dr. Fredderick PhenixBelfi for a temperature.  Thought to be related to urinary tract infection.  She was given Rocephin.  Blood pressures have been soft mostly in the 90s.  Baseline blood pressure 120 systolic.  Patient was given fluids.  Lactate is normal.  No other signs or symptoms of sepsis.  Recent blood pressure 92/48.  Patient is otherwise resting and asymptomatic.  She is ambulatory.  She is not tachycardic.  Patient medically cleared and has antibiotics ordered for UTI.  May proceed to behavioral health.   Shon BatonHorton, Armanda Forand F, MD 01/09/17 [phone]

## 2017-01-09 NOTE — ED Notes (Signed)
Pt verbalized understanding discharge instructions and denies any further needs or questions at this time. VS stable, ambulatory and steady gait.   

## 2017-01-09 NOTE — ED Notes (Signed)
[facility] [phone].

## 2017-01-10 DIAGNOSIS — G4700 Insomnia, unspecified: Secondary | ICD-10-CM

## 2017-01-10 DIAGNOSIS — F1994 Other psychoactive substance use, unspecified with psychoactive substance-induced mood disorder: Secondary | ICD-10-CM

## 2017-01-10 DIAGNOSIS — F419 Anxiety disorder, unspecified: Secondary | ICD-10-CM

## 2017-01-10 DIAGNOSIS — R450 Nervousness: Secondary | ICD-10-CM

## 2017-01-10 DIAGNOSIS — R0602 Shortness of breath: Secondary | ICD-10-CM

## 2017-01-10 DIAGNOSIS — F333 Major depressive disorder, recurrent, severe with psychotic symptoms: Secondary | ICD-10-CM

## 2017-01-10 DIAGNOSIS — R440 Auditory hallucinations: Secondary | ICD-10-CM

## 2017-01-10 DIAGNOSIS — R45851 Suicidal ideations: Secondary | ICD-10-CM

## 2017-01-10 DIAGNOSIS — F17210 Nicotine dependence, cigarettes, uncomplicated: Secondary | ICD-10-CM

## 2017-01-10 LAB — URINE CULTURE

## 2017-01-10 MED ORDER — CEPHALEXIN 500 MG PO CAPS
500.0000 mg | ORAL_CAPSULE | Freq: Two times a day (BID) | ORAL | Status: DC
  Administered 2017-01-10 – 2017-01-13 (×7): 500 mg via ORAL
  Filled 2017-01-10 (×5): qty 1
  Filled 2017-01-10: qty 7
  Filled 2017-01-10: qty 1
  Filled 2017-01-10: qty 7
  Filled 2017-01-10 (×3): qty 1

## 2017-01-10 NOTE — Progress Notes (Signed)
Patient ID: Vanessa Cross, female   DOB: [birth year], 46 y.o.   MRN: [id number]  Pt currently presents with a flat affect and guarded behavior. Pt encouraged to attend group, asks for snacks and orange juice. Pt slightly irritable, reports ongoing fatigue. Denies very mild pain in her mandibular area, reports increased pain with movement. Attends group tonight with encouragement of Clinical research associatewriter. Pt reports good sleep with current medication regimen, requests to take it earlier tomorrow night.   Pt provided with medications per providers orders. Pt's labs and vitals were monitored throughout the night. Pt given a 1:1 about emotional and mental status. Pt supported and encouraged to express concerns and questions. Pt educated on medications.   Pt's safety ensured with 15 minute and environmental checks. Pt currently denies SI/HI and A/V hallucinations. Pt verbally agrees to seek staff if SI/HI or A/VH occurs and to consult with staff before acting on any harmful thoughts. Will continue POC.

## 2017-01-10 NOTE — [facility] Suicide Risk Assessment (Signed)
[facility] INPATIENT:  Family/Significant Other Suicide Prevention Education  Suicide Prevention Education:  Patient Refusal for Family/Significant Other Suicide Prevention Education: The patient Vanessa Cross has refused to provide written consent for family/significant other to be provided Family/Significant Other Suicide Prevention Education during admission and/or prior to discharge.  Physician notified.  SPE completed with pt, as pt refused to consent to family contact. SPI pamphlet provided to pt and pt was encouraged to share information with support network, ask questions, and talk about any concerns relating to SPE. Pt denies access to guns/firearms and verbalized understanding of information provided. Mobile Crisis information also provided to pt.   Shamara Soza N Smart LCSW 01/10/2017, 1:10 PM

## 2017-01-10 NOTE — BHH Counselor (Signed)
Adult Comprehensive Assessment  Patient ID: Vanessa Cross, female   DOB: 12-Jul-1970, 46 y.o.   MRN: [id number]  Information Source: Information source: Patient  Current Stressors:  Educational / Learning stressors: high school Employment / Job issues: unemployed; on disability/medicaid/medicare Family Relationships: poor. "I don't want anyone knowing I'm here. recently, I left my daughter's home to move here." Financial / Lack of resources (include bankruptcy): medicaid and [employer]icare Housing / Lack of housing: homeless currently; recently [place] back [place] Physical health (include injuries & life threatening diseases): HIV positive Social relationships: poor Substance abuse: crack cocaine and marijuana  Bereavement / Loss: none identified   Living/Environment/Situation:  Living Arrangements: Alone Living conditions (as described by patient or guardian): homeless for past few weeks  How long has patient lived in current situation?: few weeks. [place], [place] to move back to [place].  What is atmosphere in current home: Chaotic, Temporary, Dangerous  Family History:  Marital status: Single Are you sexually active?: No What is your sexual orientation?: heterosexual Has your sexual activity been affected by drugs, alcohol, medication, or emotional stress?: n/a  Does patient have children?: Yes How many children?: 1 How is patient's relationship with their children?: adult daughter. "I've been taking care of her for the past few months because she's been sick. We don't get along right now."   Childhood History:  By whom was/is the patient raised?: Both parents Additional childhood history information: married parents; no mental illness or substance abuse issues with parents.  Description of patient's relationship with caregiver when they were a child: close to parents Patient's description of current relationship with people who raised him/her: deceased How  were you disciplined when you got in trouble as a child/adolescent?: n/a  Does patient have siblings?: Yes Number of Siblings: 1 Description of patient's current relationship with siblings: "My sister has mental health issues."  Did patient suffer any verbal/emotional/physical/sexual abuse as a child?: No Did patient suffer from severe childhood neglect?: No Has patient ever been sexually abused/assaulted/raped as an adolescent or adult?: No Was the patient ever a victim of a crime or a disaster?: No Witnessed domestic violence?: No Has patient been effected by domestic violence as an adult?: No  Education:  Highest grade of school patient has completed: high school  Currently a Consulting civil engineerstudent?: No Learning disability?: No  Employment/Work Situation:   Employment situation: On disability Why is patient on disability: mental health issues How long has patient been on disability: few years  Patient's job has been impacted by current illness: No What is the longest time patient has a held a job?: n/a  Where was the patient employed at that time?: n/a  Has patient ever been in the [employer] and Companymilitary?: No Has patient ever served in combat?: No Are There Guns or Other Weapons in Your Home?: No Are These ComptrollerWeapons Safely Secured?: (n/a)  Financial Resources:   Surveyor, quantityinancial resources: [employer]icaid, Insurance claims handlereceives SSDI, Food stamps Does patient have a Lawyerrepresentative payee or guardian?: No  Alcohol/Substance Abuse:   What has been your use of drugs/alcohol within the last 12 months?: Crack cocaine and marijuana use-relapsed after a few months of sobriety recently; no alcohol recently per pt.  If attempted suicide, did drugs/alcohol play a role in this?: No(passive SI when high) Alcohol/Substance Abuse Treatment Hx: Denies past history, Past Tx, Outpatient If yes, describe treatment: [facility] history Has alcohol/substance abuse ever caused legal problems?: No  Social Support System:   Conservation officer, natureatient's Community Support System:  Poor Describe Community  Support System: few supports in community; "I don't know anyone here which made feel scared and suicidal."  Type of faith/religion: christian How does patient's faith help to cope with current illness?: prayer  Leisure/Recreation:   Leisure and Hobbies: "nothing."  Strengths/Needs:   What things does the patient do well?: motivated to get into treatment and obtain stable housing In what areas does patient struggle / problems for patient: coping skills; relapsing; limited/poor social supports  Discharge Plan:   Does patient have access to transportation?: Yes(bus) Will patient be returning to same living situation after discharge?: No Plan for living situation after discharge: Pt hoping to get into [facility] from here.  Currently receiving community mental health services: Yes (From Whom)([facility]) If no, would patient like referral for services when discharged?: Yes (What county?)([place]; [place] and [facility] [place]) Does patient have financial barriers related to discharge medications?: No(medicaid and medicare)  Summary/Recommendations:   Summary and Recommendations (to be completed by the evaluator): Patient is 46yo female who presents as homeless in [place], [place] She has a diagnosis of MDD and Cocaine Use Disorder. patient presents to the hospital seeking treatment for SI, depression, crack cocaine/marijuana abuse, and for medication stabilization. She is interested in residential treatment from here. Patient is single, with one adult daughter, and is on disability. Recommendations for patient include: crisis stabilization, therapeutic milieu, encourage group attendance and participation, medication management for mood stabilization/detox, and development of comprehensive mental wellness/sobriety plan. CSW continuing to assess for appropriate referrals.   Nakeia Calvi N Smart LCSW 01/10/2017 1:10 PM

## 2017-01-10 NOTE — Progress Notes (Signed)
Recreation Therapy Notes Animal-Assisted Activity (AAA) Program Checklist/Progress Notes Patient Eligibility Criteria Checklist & Daily Group note for Rec TxIntervention  Date: 12.04.2018 Time: 2:45pm Location: [address]   AAA/T Program Assumption of Risk Form signed by Patient/ or Parent Legal Guardian Yes  Patient is free of allergies or sever asthma Yes  Patient reports no fear of animals Yes  Patient reports no history of cruelty to animals Yes  Patient understands his/her participation is voluntary Yes  Behavioral Response: Did not attend.   Marykay Lexenise L Deepa Barthel, LRT/CTRS       Darell Saputo L 01/10/2017 2:57 PM
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
## 2017-01-10 NOTE — Progress Notes (Signed)
D: Pt presents with a flat affect and depressed mood. Pt rates depression 9/10. Pt denies SI.  Pt denies AVH. Pt reports fair sleep last night. Pt have minimal interaction on the unit but with encouragement but will attend scheduled groups. Pt compliant with meds. No side effects to meds verbalized by pt. A: Medications reviewed with pt. Medications administered as ordered per MD. Verbal support provided. Pt encouraged to attend groups. 15 minute checks performed for safety. R: Pt compliant with tx.
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
## 2017-01-10 NOTE — Progress Notes (Signed)
CSW met with pt to discuss disposition options. [facility] interview scheduled for 1:00PM today with Shayla. Possible bed on Thursday if pt meets criteria. Pt also given information to [facility] in [place], which accepts her [facility] for outpatient medication management.   Maxie Better, MSW, LCSW Clinical Social Worker 01/10/2017 1:00 PM

## 2017-01-10 NOTE — Plan of Care (Signed)
Pt compliant with taking meds. No side effects to meds verbalized by pt.

## 2017-01-11 DIAGNOSIS — F39 Unspecified mood [affective] disorder: Secondary | ICD-10-CM

## 2017-01-11 DIAGNOSIS — F1210 Cannabis abuse, uncomplicated: Secondary | ICD-10-CM

## 2017-01-11 DIAGNOSIS — F1410 Cocaine abuse, uncomplicated: Secondary | ICD-10-CM

## 2017-01-11 NOTE — Progress Notes (Signed)
D: Pt was in bed in her room upon initial approach.  Pt presents with depressed affect and mood.  Speech is soft, eye contact brief, forwards little information.  Describes her day as "alright" and reports goal is to "sleep."  Pt denies SI/HI, denies hallucinations, reports L-sided jaw pain of 8/10.  Pt has been isolative to her room for the majority of the night and she did not attend evening group.    A: Introduced self to pt.  Actively listened to pt and offered support and encouragement. Medications offered per order.  PRN medication administered for pain.  Q15 minute safety checks maintained.  R: Pt is safe on the unit.  Pt refused to take her scheduled Seroquel despite multiple prompts from staff to come to medication window for medication administration.  Pt verbally contracts for safety.  Will continue to monitor and assess.
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
## 2017-01-11 NOTE — Progress Notes (Signed)
Pt invited, but declined NA group this evening.  
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
## 2017-01-11 NOTE — Progress Notes (Signed)
Recreation Therapy Notes  Date:  01/11/17  Time: 0930 Location: 300 Hall Group Room  Group Topic: Stress Management  Goal Area(s) Addresses:  Patient will verbalize importance of using healthy stress management.  Patient will identify positive emotions associated with healthy stress management.   Intervention: Stress Management  Activity :  Meditation.  LRT introduced the stress management technique of meditation.  Patients were to follow along with the meditation as it guided them on letting things that are burdensome in order to progress.  Education:  Stress Management, Discharge Planning.   Education Outcome: Acknowledges edcuation/In group clarification offered/Needs additional education  Clinical Observations/Feedback: Pt did not attend group.    Caroll RancherMarjette Angele Wiemann, LRT/CTRS         Caroll RancherLindsay, Parissa Chiao A 01/11/2017 12:11 PM

## 2017-01-11 NOTE — Plan of Care (Signed)
  Progressing Safety: Periods of time without injury will increase 01/11/2017 2307 - Progressing by Arrie Aranhurch, Saylee Sherrill J, RN Note Pt has not harmed self or others tonight.  She denies SI/HI and verbally contracts for safety.

## 2017-01-12 MED ORDER — ENSURE ENLIVE PO LIQD
237.0000 mL | Freq: Two times a day (BID) | ORAL | Status: DC
  Administered 2017-01-13: 237 mL via ORAL

## 2017-01-12 NOTE — Progress Notes (Signed)
D: Pt was in bed in her room upon initial approach.  Pt presents with depressed affect and mood.  She describes her day as "alright" and states she "ain't been eating good" and "I'm just depressed."  Pt denies SI/HI, denies hallucinations, denies pain.  Pt has been isolative to her room for the majority of the night.   A: Introduced self to pt.  Actively listened to pt and offered support and encouragement. Medications administered per order.  Pt provided with Gatorade and meal.  PO intake encouraged.  Q15 minute safety checks maintained.  R: Pt is safe on the unit.  Pt is compliant with medications.  Pt verbally contracts for safety.  Will continue to monitor and assess.

## 2017-01-12 NOTE — Progress Notes (Signed)
[facility] MD Progress Note  01/12/2017 1:45 PM Vanessa Cross  MRN:  [id number]  Subjective: Vanessa Cross reports, "I'm depressed off & on because I don't know what will happen after I get discharged from here, I got no place to go. I stay in my room all the time. I don't like to go to group sessions because I don't like to be around other people"  Objective: Vanessa Cross is a 46 y/o F with history of MDD and polysubstance abuse who was admitted with worsening depression and SI. Pt took ambulance to [facility] 3 days ago after she had worsening depression and SI. Pt has stressor of recently returning to the [place] area and not having housing ( pt had been staying at [facility] but she when to [place] to help her mother and lost housing after she left). Today, 01-12-17, Vanessa Cross is seen, chart reviewed, Discussed this case with the treatment team. She is lying down in her bed in her room. She presents alert, oriented & aware of situation. She is not very visible on the unit most of the time except when to take medications or go to the cafeteria to eat. She is currently not attending group sessions, at least, not yet. She says she does not like to be around other people. She is currently working with the Child psychotherapistsocial worker for placement issues for further substance abuse treatment after discharge. She has been accepted & will be going to [facility]. She is taking & tolerating her medications. She currently denies any SIHI, AVH, delusional thoughts or paranoia. She does not appear to be responding to any internal stimuli. Vanessa Cross is encouraged to attend & participate in the group sessions to help her learn coping skills. No changes made on her current plan of care.  Principal Problem: Substance induced mood disorder (HCC)  Diagnosis:   Patient Active Problem List   Diagnosis Date Noted  . Substance induced mood disorder (HCC) [F19.94] 01/10/2017    Priority: High  . MDD (major depressive  disorder) [F32.9] 01/09/2017  . Alcohol abuse with alcohol-induced mood disorder (HCC) [F10.14] 06/20/2016  . Unspecified viral hepatitis C without hepatic coma [B19.20] 10/11/2013  . Tobacco use disorder [F17.200] 10/11/2013  . 2012 [N82.956][Z87.898] 10/11/2013  . Genital herpes [A60.00] 10/11/2013  . History of trichomoniasis [Z86.19] 10/11/2013  . History of gonorrhea [Z86.19] 10/11/2013   Total Time spent with patient: 25 minutes  Past Psychiatric History: See Md's SRA.  Past Medical History:  Past Medical History:  Diagnosis Date  . HIV (human immunodeficiency virus infection) (HCC)     Past Surgical History:  Procedure Laterality Date  . ABDOMINAL HYSTERECTOMY  2008   Family History: History reviewed. No pertinent family history.  Family Psychiatric  History: See H&P  Social History:  Social History   Substance and Sexual Activity  Alcohol Use Yes  . Alcohol/week: 1.2 oz  . Types: 2 Standard drinks or equivalent per week     Social History   Substance and Sexual Activity  Drug Use Yes  . Frequency: 7.0 times per week  . Types: "Crack" cocaine, Cocaine, Marijuana   Comment: Cocaine, marijuana    Social History   Socioeconomic History  . Marital status: Single    Spouse name: None  . Number of children: None  . Years of education: None  . Highest education level: None  Social Needs  . Financial resource strain: None  . Food insecurity - worry: None  . Food  insecurity - inability: None  . Transportation needs - medical: None  . Transportation needs - non-medical: None  Occupational History  . None  Tobacco Use  . Smoking status: Current Every Day Smoker    Packs/day: 0.50    Years: 20.00    Pack years: 10.00    Types: Cigarettes  . Smokeless tobacco: Never Used  Substance and Sexual Activity  . Alcohol use: Yes    Alcohol/week: 1.2 oz    Types: 2 Standard drinks or equivalent per week  . Drug use: Yes    Frequency: 7.0 times per week    Types: "Crack"  cocaine, Cocaine, Marijuana    Comment: Cocaine, marijuana  . Sexual activity: Yes    Partners: Male    Comment: HIV+  Other Topics Concern  . None  Social History Narrative  . None   Additional Social History:   Sleep: Good  Appetite:  Good  Current Medications: Current Facility-Administered Medications  Medication Dose Route Frequency Provider Last Rate Last Dose  . acetaminophen (TYLENOL) tablet 650 mg  650 mg Oral Q6H PRN Okonkwo, Justina A, NP   650 mg at 01/11/17 2024  . alum & mag hydroxide-simeth (MAALOX/MYLANTA) 200-200-20 MG/5ML suspension 30 mL  30 mL Oral Q4H PRN Okonkwo, Justina A, NP      . cephALEXin (KEFLEX) capsule 500 mg  500 mg Oral Q12H Simon, Spencer E, PA-C   500 mg at 01/12/17 0819  . citalopram (CELEXA) tablet 10 mg  10 mg Oral Daily Okonkwo, Justina A, NP   10 mg at 01/12/17 0819  . darunavir-cobicistat (PREZCOBIX) 800-150 MG per tablet 1 tablet  1 tablet Oral Q breakfast Okonkwo, Justina A, NP   1 tablet at 01/12/17 0819  . emtricitabine-tenofovir AF (DESCOVY) 200-25 MG per tablet 1 tablet  1 tablet Oral Daily Okonkwo, Justina A, NP   1 tablet at 01/12/17 0819  . hydrOXYzine (ATARAX/VISTARIL) tablet 25 mg  25 mg Oral TID PRN Beryle Lathe, Justina A, NP      . magnesium hydroxide (MILK OF MAGNESIA) suspension 30 mL  30 mL Oral Daily PRN Okonkwo, Justina A, NP      . QUEtiapine (SEROQUEL) tablet 100 mg  100 mg Oral QHS Okonkwo, Justina A, NP   100 mg at 01/10/17 2238  . traZODone (DESYREL) tablet 50 mg  50 mg Oral QHS PRN Okonkwo, Justina A, NP       Lab Results: No results found for this or any previous visit (from the past 48 hour(s)).  Blood Alcohol level:  Lab Results  Component Value Date   ETH <10 01/08/2017   ETH 74 (H) 10/16/2016   Metabolic Disorder Labs: No results found for: HGBA1C, MPG No results found for: PROLACTIN Lab Results  Component Value Date   CHOL 158 08/15/2016   TRIG 72 08/15/2016   HDL 49 (L) 08/15/2016   CHOLHDL 3.2 08/15/2016    VLDL 14 08/15/2016   LDLCALC 95 08/15/2016   LDLCALC 77 11/20/2014   Physical Findings: AIMS: Facial and Oral Movements Muscles of Facial Expression: None, normal Lips and Perioral Area: None, normal Jaw: None, normal Tongue: None, normal,Extremity Movements Upper (arms, wrists, hands, fingers): None, normal Lower (legs, knees, ankles, toes): None, normal, Trunk Movements Neck, shoulders, hips: None, normal, Overall Severity Severity of abnormal movements (highest score from questions above): None, normal Incapacitation due to abnormal movements: None, normal Patient's awareness of abnormal movements (rate only patient's report): No Awareness, Dental Status Current problems with teeth and/or  dentures?: Yes Does patient usually wear dentures?: No  CIWA:    COWS:     Musculoskeletal: Strength & Muscle Tone: within normal limits Gait & Station: normal Patient leans: N/A  Psychiatric Specialty Exam: Physical Exam  Nursing note and vitals reviewed.   Review of Systems  Psychiatric/Behavioral: Positive for depression and substance abuse. Negative for hallucinations, memory loss and suicidal ideas. The patient is nervous/anxious. The patient does not have insomnia.     Blood pressure 99/75, pulse 82, temperature 98.4 F (36.9 C), temperature source Oral, resp. rate 16, height 5\' 2"  (1.575 m), weight 72.6 kg (160 lb).Body mass index is 29.26 kg/m.  General Appearance: Casual and Fairly Groomed  Eye Contact:  Good  Speech:  Clear and Coherent and Normal Rate  Volume:  Normal  Mood: Anxious and Depressed, "improving".  Affect: Restricted  Thought Process:  Coherent and Goal Directed  Orientation:  Full (Time, Place, and Person)  Thought Content: Logical, denies any hallucinations, delusions or paranoia.  Suicidal Thoughts: Denies any thoughts, plans or intent.  Homicidal Thoughts: Denies  Memory:  Immediate;   Fair Recent;   Fair Remote;   Fair  Judgement:  Fair  Insight:   Fair  Psychomotor Activity:  Normal  Concentration:  Concentration: Good  Recall:  Good  Fund of Knowledge:  Good  Language:  Good  Akathisia:  No  Handed:    AIMS (if indicated):     Assets:  Communication Skills Leisure Time Physical Health Resilience  ADL's:  Intact  Cognition:  WNL  Sleep:  Number of Hours: 6.75     Treatment Plan Summary: Daily contact with patient to assess and evaluate symptoms and progress in treatment. Patient is approaching her baseline. No evidence of psychosis. No evidence of mania. No dangerousness to self or others. We are finalizing aftercare   Will continue today 01/12/2017 plan as below except where it is noted.  Depression.   -Continue Citalopram 10 mg po daily.  Anxiety.   -Continue Hydroxyzine 25 mg po prn Q 6 hours.  Mood control.   -Continue Seroquel 100 mg po Q hs.  Insomnia.   -Continue Trazodone 50 mg po prn Q hs.  Other medical issues.   -Continue all other medication regimen as recommended.    -Social worker to continue to work on the discharge dsiposition plans.  Sanjuana KavaNwoko, Caius Silbernagel I, NP, PMHNP, FNP-BC. 01/12/2017, 1:45 PMPatient ID: Vanessa Cross, female   DOB: [birth year], 46 y.o.   MRN: [id number]

## 2017-01-12 NOTE — Progress Notes (Signed)
D: Patient is anxious over upcoming discharge.  She is able to go to [facility] tomorrow.  She will stay there until she is able to get into a treatment facility.  She is withdrawn and does not interact with staff except to get her medications.  She will go to the cafeteria, however, does not interact with her peers.  Patient has not been attending groups and has been encouraged to go.  Patient denies any thoughts of self harm today and does not appear to be responding to internal stimuli. A: Continue to monitor medication management and MD orders.  Safety checks completed every 15 minutes per protocol.  Offer support and encouragement as needed. R: Patient is not receptive to staff; she remains withdrawn and isolative.

## 2017-01-12 NOTE — Plan of Care (Signed)
  Progressing Activity: Sleeping patterns will improve 01/12/2017 2334 - Progressing by Arrie Aranhurch, Telsa Dillavou J, RN Note Pt slept 6.5 hours last night according to flowsheet.

## 2017-01-13 MED ORDER — EMTRICITABINE-TENOFOVIR AF 200-25 MG PO TABS: 1.0000 | ORAL_TABLET | Freq: Every day | ORAL | 0 refills | Status: DC

## 2017-01-13 MED ORDER — DARUNAVIR-COBICISTAT 800-150 MG PO TABS: 1.0000 | ORAL_TABLET | Freq: Every day | ORAL | 0 refills | Status: DC

## 2017-01-13 MED ORDER — CEPHALEXIN 500 MG PO CAPS: 500.0000 mg | ORAL_CAPSULE | Freq: Two times a day (BID) | ORAL | Status: DC

## 2017-01-13 MED ORDER — HYDROXYZINE HCL 25 MG PO TABS: 25.0000 mg | ORAL_TABLET | Freq: Three times a day (TID) | ORAL | 0 refills | Status: DC | PRN

## 2017-01-13 MED ORDER — QUETIAPINE FUMARATE 100 MG PO TABS: 100.0000 mg | ORAL_TABLET | Freq: Every day | ORAL | 0 refills | Status: DC

## 2017-01-13 MED ORDER — CITALOPRAM HYDROBROMIDE 10 MG PO TABS: 10.0000 mg | ORAL_TABLET | Freq: Every day | ORAL | 0 refills | Status: DC

## 2017-01-13 MED ORDER — TRAZODONE HCL 50 MG PO TABS: 50.0000 mg | ORAL_TABLET | Freq: Every evening | ORAL | 0 refills | Status: DC | PRN

## 2017-01-13 MED FILL — DESCOVY 200-25 MG TABS: 200-25 | 30 days supply | Qty: 30 | Fill #0

## 2017-01-13 MED FILL — PREZCOBIX 800 MG-150 MG TAB: 800-150 | 30 days supply | Qty: 30 | Fill #0

## 2017-01-13 NOTE — Progress Notes (Signed)
Discharge Note:  Patient discharged with driver of [facility].  Driver was in car in parking lot and patient and information, medications, etc. given to driver when nurse informed that driver here to pick up patient.  Patient denied SI and HI.  Denied A/V hallucinations.  Suicide prevention information given and discussed with patient who stated she understood and had no questions.  Patient stated she received all her belongings.  Patient stated she appreciated all assistance received from [facility] staff.  All required discharge information given to patient at discharge.
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
## 2017-01-13 NOTE — Progress Notes (Signed)
Recreation Therapy Notes  Date: 01/13/17 Time: 0930 Location: 300 Hall Dayroom  Group Topic: Stress Management  Goal Area(s) Addresses:  Patient will verbalize importance of using healthy stress management.  Patient will identify positive emotions associated with healthy stress management.   Behavioral Response: Engaged  Intervention: Stress Management  Activity :  LRT introduced the stress management technique of meditation.  LRT read a script to allow patients to focus on being steadfast in different situations.  Education:  Stress Management, Discharge Planning.   Education Outcome: Acknowledges edcuation/In group clarification offered/Needs additional education  Clinical Observations/Feedback: Pt attended group.    Caroll RancherMarjette Rudolph Dobler, LRT/CTRS     Lillia AbedLindsay, Taylan Mayhan A 01/13/2017 10:30 AM

## 2017-01-13 NOTE — Progress Notes (Signed)
  [facility] Discharge Plan :  Will you be returning to the same living situation after discharge:  No.  At discharge, do you have transportation home?: Yes,  [facility] be [facility] her [facility] on Friday-[name] [phone] Do you have the ability to pay for your medications: Yes,  [facility] Medicaid  Release of information consent forms completed and submitted to medical records by CSW.  Patient to Follow up at: Follow-up Information    [facility] Follow up.   Specialty:  Addiction Medicine Why:  [facility], however no bed immediately available. [facility] Mccreedy([name]) will pick you up on Friday and will keep you until [facility] bed becomes available. YOU MUST HAVE 30 DAY SUPPLY OF ALL MEDICATIONS AND 30 PRESCRIPTION.  Contact information: [address] [place] [address] [phone]        [facility] Follow up.   Specialty:  Behavioral Health Why:  Walk in within 3 days of [facility] discharge to be assessed for outpatient mental health services including medication management and counseling. Walk in hours: Mon-Fri 8am-9am. Thank you.  Contact information: [address] [place] [address] [phone]           Next level of care provider has access to [facility] Link:no  Safety Planning and Suicide Prevention discussed: Yes,  SPE completed with pt; pt declined to consent to family contact.   Have you used any form of tobacco in the last 30 days? (Cigarettes, Smokeless Tobacco, Cigars, and/or Pipes): Yes  Has patient been referred to the Quitline?: Patient refused referral  Patient has been referred for addiction treatment: Yes  Pulte HomesHeather N Smart, LCSW 01/13/2017, 8:35 AM
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
## 2017-01-13 NOTE — Progress Notes (Signed)
CSW spoke with Britta [facility]ccreedyBarbara at [facility] on Thursday,12/6, who stated at that time that she would be available to pick pt up sometime on Friday once MD note and medication list was faxed. CSW also confirmed with NP/MD that 30 day supply and 30 day prescription for ALL medications was mandatory for [facility].  [facility]. CSW left multiple messages for Britta MccreedyBarbara on 12/6 and this morning to confirm that she received paperwork and to confirm pick up time for pt. [facility] and will be going to [facility] to wait for an [facility] bed to become available. Britta MccreedyBarbara has not yet called back. CSW asked that Britta MccreedyBarbara call RN station at [phone] or [facility] [phone] to confirm time of pickup, as this CSW will be out of the office later this morning.  Trula SladeHeather Smart, MSW, LCSW Clinical Social Worker 01/13/2017 9:21 AM

## 2017-01-13 NOTE — Progress Notes (Signed)
D:  Patient denied SI and HI, contracts for safety.  Denied A/V hallucinations.  Denied pain. A:  Medications administered per MD orders.  Emotional support and encouragement given patient. R:  Safety maintained with 15 minute checks.  

## 2017-01-24 ENCOUNTER — Encounter: Payer: Self-pay | Admitting: Internal Medicine

## 2017-01-24 ENCOUNTER — Ambulatory Visit (INDEPENDENT_AMBULATORY_CARE_PROVIDER_SITE_OTHER): Payer: Medicaid Other | Admitting: Internal Medicine

## 2017-01-24 ENCOUNTER — Ambulatory Visit: Payer: Self-pay | Admitting: Internal Medicine

## 2017-01-24 VITALS — BP 124/75 | HR 89 | Temp 98.4°F | Wt 157.0 lb

## 2017-01-24 DIAGNOSIS — B20 Human immunodeficiency virus [HIV] disease: Secondary | ICD-10-CM | POA: Diagnosis present

## 2017-01-24 DIAGNOSIS — B182 Chronic viral hepatitis C: Secondary | ICD-10-CM | POA: Diagnosis not present

## 2017-01-24 DIAGNOSIS — Z87898 Personal history of other specified conditions: Secondary | ICD-10-CM

## 2017-01-24 DIAGNOSIS — F320 Major depressive disorder, single episode, mild: Secondary | ICD-10-CM | POA: Diagnosis not present

## 2017-01-24 DIAGNOSIS — Z23 Encounter for immunization: Secondary | ICD-10-CM

## 2017-01-24 DIAGNOSIS — F1911 Other psychoactive substance abuse, in remission: Secondary | ICD-10-CM

## 2017-01-24 MED ORDER — QUETIAPINE FUMARATE 100 MG PO TABS: 100.0000 mg | ORAL_TABLET | Freq: Every day | ORAL | 4 refills | Status: AC

## 2017-01-24 MED ORDER — HYDROXYZINE HCL 25 MG PO TABS: 25.0000 mg | ORAL_TABLET | Freq: Three times a day (TID) | ORAL | 4 refills | Status: AC | PRN

## 2017-01-24 MED ORDER — DARUNAVIR-COBICISTAT 800-150 MG PO TABS: 1.0000 | ORAL_TABLET | Freq: Every day | ORAL | 4 refills | Status: AC

## 2017-01-24 MED ORDER — EMTRICITABINE-TENOFOVIR AF 200-25 MG PO TABS: 1.0000 | ORAL_TABLET | Freq: Every day | ORAL | 4 refills | Status: AC

## 2017-01-24 MED ORDER — CITALOPRAM HYDROBROMIDE 10 MG PO TABS: 10.0000 mg | ORAL_TABLET | Freq: Every day | ORAL | 4 refills | Status: AC

## 2017-01-24 NOTE — Progress Notes (Signed)
RFV:  Follow up for HIV disease  Patient ID: Vanessa Cross, female   DOB: Dec 29, 1970, 46 y.o.   MRN: [id number]  HPI 46yo F with hiv disease, CD 4 count of 310/VL230,240 ( from 2016) - currently on descovy-DRVc. She was taking meds from July til beginning of October when she ran out of meds. Living with her daughter.  She was hospitalized in beginning of December for suicide ideation from 12/02 -12/07. She was discharged on Citalopram 10 mg for depression, Hydroxyzine 25 mg prn for anxiety, Seroquel 100 mg for mood control & Trazodone 50 mg for insomnia.  Since discharge she has been a [facility] abuse treatment [facility].  And will be substance abuse treatment program - [facility] on 12/[facility]    She is no longer taking trazodone since she notices hallucination  Citalopram 10mg  depression Hydroxyzine 25mg  TID PRN as needed seroquel 100mg  daily  Just started descovy-DRVc  For roughly 2 wks.  Soc hx = started back on smoking 1/2ppd. No other illicit drugs  Outpatient Encounter Medications as of 01/24/2017  Medication Sig  . citalopram (CELEXA) 10 MG tablet Take 1 tablet (10 mg total) by mouth daily. For depression  . darunavir-cobicistat (PREZCOBIX) 800-150 MG tablet Take 1 tablet by mouth daily with breakfast. Swallow whole. Do NOT crush, break or chew tablets, (Take with food): HIV infection  . emtricitabine-tenofovir AF (DESCOVY) 200-25 MG tablet Take 1 tablet by mouth daily. For HIV infection  . hydrOXYzine (ATARAX/VISTARIL) 25 MG tablet Take 1 tablet (25 mg total) by mouth 3 (three) times daily as needed for anxiety.  Marland Kitchen. QUEtiapine (SEROQUEL) 100 MG tablet Take 1 tablet (100 mg total) by mouth at bedtime. For mood control  . traZODone (DESYREL) 50 MG tablet Take 1 tablet (50 mg total) by mouth at bedtime as needed for sleep.  . cephALEXin (KEFLEX) 500 MG capsule Take 1 capsule (500 mg total) by mouth every 12 (twelve) hours. For urinary tract infection  (Patient not taking: Reported on 01/24/2017)   No facility-administered encounter medications on file as of 01/24/2017.      Patient Active Problem List   Diagnosis Date Noted  . Substance induced mood disorder (HCC) 01/10/2017  . MDD (major depressive disorder) 01/09/2017  . Alcohol abuse with alcohol-induced mood disorder (HCC) 06/20/2016  . 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 Tobacco use disorder 10/11/2013  . 2012 10/11/2013  . Genital herpes 10/11/2013  . History of trichomoniasis 10/11/2013  . History of gonorrhea 10/11/2013     Health Maintenance Due  Topic Date Due  . TETANUS/TDAP  02/06/1988  . PAP SMEAR  04/03/2016     Review of Systems - she reports feeling like she still has some symptoms of insomnia Physical Exam   BP 124/75   Pulse 89   Temp 98.4 F (36.9 C)   Wt 157 lb (71.2 kg)   BMI 28.72 kg/m    Constitutional:  oriented to person, place, and time. appears older than stated ageand well-nourished. No distress.  HENT: [place]/AT, PERRLA, no scleral icterus, partial shaved head Mouth/Throat: Oropharynx is clear and moist. No oropharyngeal exudate. eduntulous Cardiovascular: Normal rate, regular rhythm and normal heart sounds. Exam reveals no gallop and no friction rub.  No murmur heard.  Pulmonary/Chest: Effort normal and breath sounds normal. No respiratory distress.  has no wheezes.  Neck = supple, no nuchal rigidity Lymphadenopathy: no cervical adenopathy. No axillary adenopathy Neurological: alert and oriented to  person, place, and time.  Skin: Skin is warm and dry. No rash noted. No erythema.  Psychiatric: a normal mood and affect.  behavior is normal.   Lab Results  Component Value Date   CD4TCELL 16 (L) 11/20/2014   Lab Results  Component Value Date   CD4TABS 310 (L) 11/20/2014   CD4TABS 430 03/11/2014   CD4TABS 610 12/19/2013   Lab Results  Component Value Date   HIV1RNAQUANT 230,241 (H) 11/20/2014   Lab  Results  Component Value Date   HEPBSAB NEG 08/15/2016   Lab Results  Component Value Date   LABRPR NON REAC 08/15/2016    CBC Lab Results  Component Value Date   WBC 7.9 01/08/2017   RBC 4.32 01/08/2017   HGB 12.7 01/08/2017   HCT 37.2 01/08/2017   PLT 218 01/08/2017   MCV 86.1 01/08/2017   MCH 29.4 01/08/2017   MCHC 34.1 01/08/2017   RDW 14.1 01/08/2017   LYMPHSABS 1.5 01/08/2017   MONOABS 0.7 01/08/2017   EOSABS 0.1 01/08/2017    BMET Lab Results  Component Value Date   NA 139 01/08/2017   K 3.8 01/08/2017   CL 109 01/08/2017   CO2 20 (L) 01/08/2017   GLUCOSE 89 01/08/2017   BUN 13 01/08/2017   CREATININE 1.01 (H) 01/08/2017   CALCIUM 8.4 (L) 01/08/2017   GFRNONAA >60 01/08/2017   GFRAA >60 01/08/2017      Assessment and Plan  hiv disease = will need to get labs today, continue  descovy-DRVc daily. Will also check genotype since unclear if she has resistance for her current regimen  Major depression = continue on celexa 10mg  daily plus seroquel 100mg  qhs  Anxiety = will continue on atarax 25mg  TID PRN and celexa 10mg  daily  Substance abuse = going to treatment program- will need to give rx for them to fill.  Chronic hepatitis C = will need to treat in the near future. We would like to have her be in treatment for 3-6 months. Thus far starting out in the right path. Will need hepatitis b vaccine series again. Not immune based on labs from [place]  We will need to coordinate with [facility] treatment program in [place] for her visits and future management  Health maintenance = booster for meningococcal will be given today as well.

## 2017-01-25 LAB — CBC WITH DIFFERENTIAL/PLATELET
BASOS ABS: 43 {cells}/uL (ref 0–200)
Basophils Relative: 0.5 %
EOS ABS: 292 {cells}/uL (ref 15–500)
Eosinophils Relative: 3.4 %
HEMATOCRIT: 33.7 % — AB (ref 35.0–45.0)
HEMOGLOBIN: 11.3 g/dL — AB (ref 11.7–15.5)
LYMPHS ABS: 2804 {cells}/uL (ref 850–3900)
MCH: 29 pg (ref 27.0–33.0)
MCHC: 33.5 g/dL (ref 32.0–36.0)
MCV: 86.4 fL (ref 80.0–100.0)
MPV: 11.4 fL (ref 7.5–12.5)
Monocytes Relative: 7.6 %
NEUTROS ABS: 4807 {cells}/uL (ref 1500–7800)
NEUTROS PCT: 55.9 %
Platelets: 281 10*3/uL (ref 140–400)
RBC: 3.9 10*6/uL (ref 3.80–5.10)
RDW: 13.8 % (ref 11.0–15.0)
Total Lymphocyte: 32.6 %
WBC: 8.6 10*3/uL (ref 3.8–10.8)
WBCMIX: 654 {cells}/uL (ref 200–950)

## 2017-01-25 LAB — COMPLETE METABOLIC PANEL WITH GFR
AG RATIO: 1 (calc) (ref 1.0–2.5)
ALBUMIN MSPROF: 3.4 g/dL — AB (ref 3.6–5.1)
ALKALINE PHOSPHATASE (APISO): 82 U/L (ref 33–115)
ALT: 17 U/L (ref 6–29)
AST: 24 U/L (ref 10–35)
BILIRUBIN TOTAL: 0.4 mg/dL (ref 0.2–1.2)
BUN: 16 mg/dL (ref 7–25)
CO2: 27 mmol/L (ref 20–32)
CREATININE: 1.04 mg/dL (ref 0.50–1.10)
Calcium: 9.2 mg/dL (ref 8.6–10.2)
Chloride: 107 mmol/L (ref 98–110)
GFR, Est African American: 74 mL/min/{1.73_m2} (ref >=60)
GFR, Est Non African American: 64 mL/min/{1.73_m2} (ref >=60)
GLOBULIN: 3.5 g/dL (ref 1.9–3.7)
Glucose, Bld: 77 mg/dL (ref 65–99)
POTASSIUM: 4.3 mmol/L (ref 3.5–5.3)
SODIUM: 142 mmol/L (ref 135–146)
Total Protein: 6.9 g/dL (ref 6.1–8.1)

## 2017-01-25 LAB — T-HELPER CELL (CD4) - (RCID CLINIC ONLY)
CD4 % Helper T Cell: 20 % — ABNORMAL LOW (ref 33–55)
CD4 T Cell Abs: 580 /uL (ref 400–2700)

## 2017-01-25 LAB — RPR: RPR: NONREACTIVE

## 2017-01-27 ENCOUNTER — Telehealth: Payer: Self-pay | Admitting: *Deleted

## 2017-01-27 NOTE — Telephone Encounter (Signed)
[facility] results fax[facility]. Spoke to a nurse and advised I needed a medical release. Release received and office note, snap shot, and lab results faxed to [phone]. Advised that the viral load has not resulted yet. Confirmation received Vanessa Cross

## 2017-02-17 LAB — HIV-1 RNA ULTRAQUANT REFLEX TO GENTYP+
HIV 1 RNA Quant: 29400 Copies/mL — ABNORMAL HIGH
HIV-1 RNA QUANT, LOG: 4.47 {Log_copies}/mL — AB

## 2017-02-17 LAB — RFLX HIV-1 INTEGRASE GENOTYPE: HIV-1 Genotype: NOT DETECTED

## 2017-03-09 ENCOUNTER — Ambulatory Visit: Payer: Self-pay | Admitting: Internal Medicine

## 2018-07-19 ENCOUNTER — Telehealth: Payer: Self-pay

## 2018-07-19 NOTE — Telephone Encounter (Signed)
Patient will call back to schedule NP appt.

## 2018-08-01 ENCOUNTER — Ambulatory Visit: Payer: Self-pay | Admitting: Cardiology

## 2018-08-01 NOTE — Progress Notes (Deleted)
Primary Physician:  Patient, No Pcp Per   Patient ID: Vanessa Cross, female    DOB: [birth year], 48 y.o.   MRN: [id number]  Subjective:    No chief complaint on file.   HPI: Vanessa Cross  is a 48 y.o. female  with hyperlipidemia, chronic hepatitis C, HIV, depression with history of suicidal ideation, history of alcohol abuse as well as illicit drug use, tobacco use, patient reported history of [place] while living in [place] referred to us by Dr. Julio Sickssei-Bonsu for evaluation and management of CAD.   Past Medical History:  Diagnosis Date  . HIV (human immunodeficiency virus infection) (HCC)     Past Surgical History:  Procedure Laterality Date  . ABDOMINAL HYSTERECTOMY  2008    Social History   Socioeconomic History  . Marital status: Single    Spouse name: Not on file  . Number of children: Not on file  . Years of education: Not on file  . Highest education level: Not on file  Occupational History  . Not on file  Social Needs  . Financial resource strain: Not on file  . Food insecurity    Worry: Not on file    Inability: Not on file  . Transportation needs    Medical: Not on file    Non-medical: Not on file  Tobacco Use  . Smoking status: Current Every Day Smoker    Packs/day: 0.50    Years: 20.00    Pack years: 10.00    Types: Cigarettes  . Smokeless tobacco: Never Used  Substance and Sexual Activity  . Alcohol use: Yes    Alcohol/week: 2.0 standard drinks    Types: 2 Standard drinks or equivalent per week  . Drug use: Yes    Frequency: 7.0 times per week    Types: "Crack" cocaine, Cocaine, Marijuana    Comment: Cocaine, marijuana  . Sexual activity: Yes    Partners: Male    Comment: HIV+  Lifestyle  . Physical activity    Days per week: Not on file    Minutes per session: Not on file  . Stress: Not on file  Relationships  . Social Musicianconnections    Talks on phone: Not on file    Gets together: Not on file    Attends religious service: Not on file    Active  member of club or organization: Not on file    Attends meetings of clubs or organizations: Not on file    Relationship status: Not on file  . Intimate partner violence    Fear of current or ex partner: Not on file    Emotionally abused: Not on file    Physically abused: Not on file    Forced sexual activity: Not on file  Other Topics Concern  . Not on file  Social History Narrative  . Not on file    ROS    Objective:  There were no vitals taken for this visit. There is no height or weight on file to calculate BMI.    Physical Exam Radiology: No results found.  Laboratory examination:   *** CMP Latest Ref Rng & Units 01/24/2017 01/08/2017 10/16/2016  Glucose 65 - 99 mg/dL 77 89 94  BUN 7 - 25 mg/dL 16 13 9   Creatinine 0.50 - 1.10 mg/dL 1.191.04 1.47(W1.01(H) 2.950.80  Sodium 135 - 146 mmol/L 142 139 143  Potassium 3.5 - 5.3 mmol/L 4.3 3.8 3.8  Chloride 98 - 110 mmol/L 107 109 112(H)  CO2 20 -  32 mmol/L 27 20(L) 23  Calcium 8.6 - 10.2 mg/dL 9.2 8.4(L) 8.9  Total Protein 6.1 - 8.1 g/dL 6.9 7.5 7.9  Total Bilirubin 0.2 - 1.2 mg/dL 0.4 1.0 0.5  Alkaline Phos 38 - 126 U/L - 93 88  AST 10 - 35 U/L 24 42(H) 34  ALT 6 - 29 U/L 17 18 20    CBC Latest Ref Rng & Units 01/24/2017 01/08/2017 10/16/2016  WBC 3.8 - 10.8 Thousand/uL 8.6 7.9 5.3  Hemoglobin 11.7 - 15.5 g/dL 11.3(L) 12.7 12.5  Hematocrit 35.0 - 45.0 % 33.7(L) 37.2 39.4  Platelets 140 - 400 Thousand/uL 281 218 236   Lipid Panel     Component Value Date/Time   CHOL 158 08/15/2016 1650   TRIG 72 08/15/2016 1650   HDL 49 (L) 08/15/2016 1650   CHOLHDL 3.2 08/15/2016 1650   VLDL 14 08/15/2016 1650   LDLCALC 95 08/15/2016 1650   HEMOGLOBIN A1C No results found for: HGBA1C, MPG TSH No results for input(s): TSH in the last 8760 hours.  PRN Meds:. There are no discontinued medications. No outpatient medications have been marked as taking for the 08/01/18 encounter (Appointment) with Miquel Dunn, NP.    Cardiac Studies:    ***  Assessment:   No diagnosis found.  ***  Recommendations:   ***  Miquel Dunn, MSN, APRN, FNP-C [facility]. [place] Office: [phone] Fax: [phone]

## 2018-08-19 IMAGING — DX DG CHEST 1V PORT
1 series · 1 of 1 positions shown · non-contrast
Comparison: 07/17/2014

CLINICAL DATA: Chest pain

EXAM:
PORTABLE CHEST 1 VIEW

[chest ap]
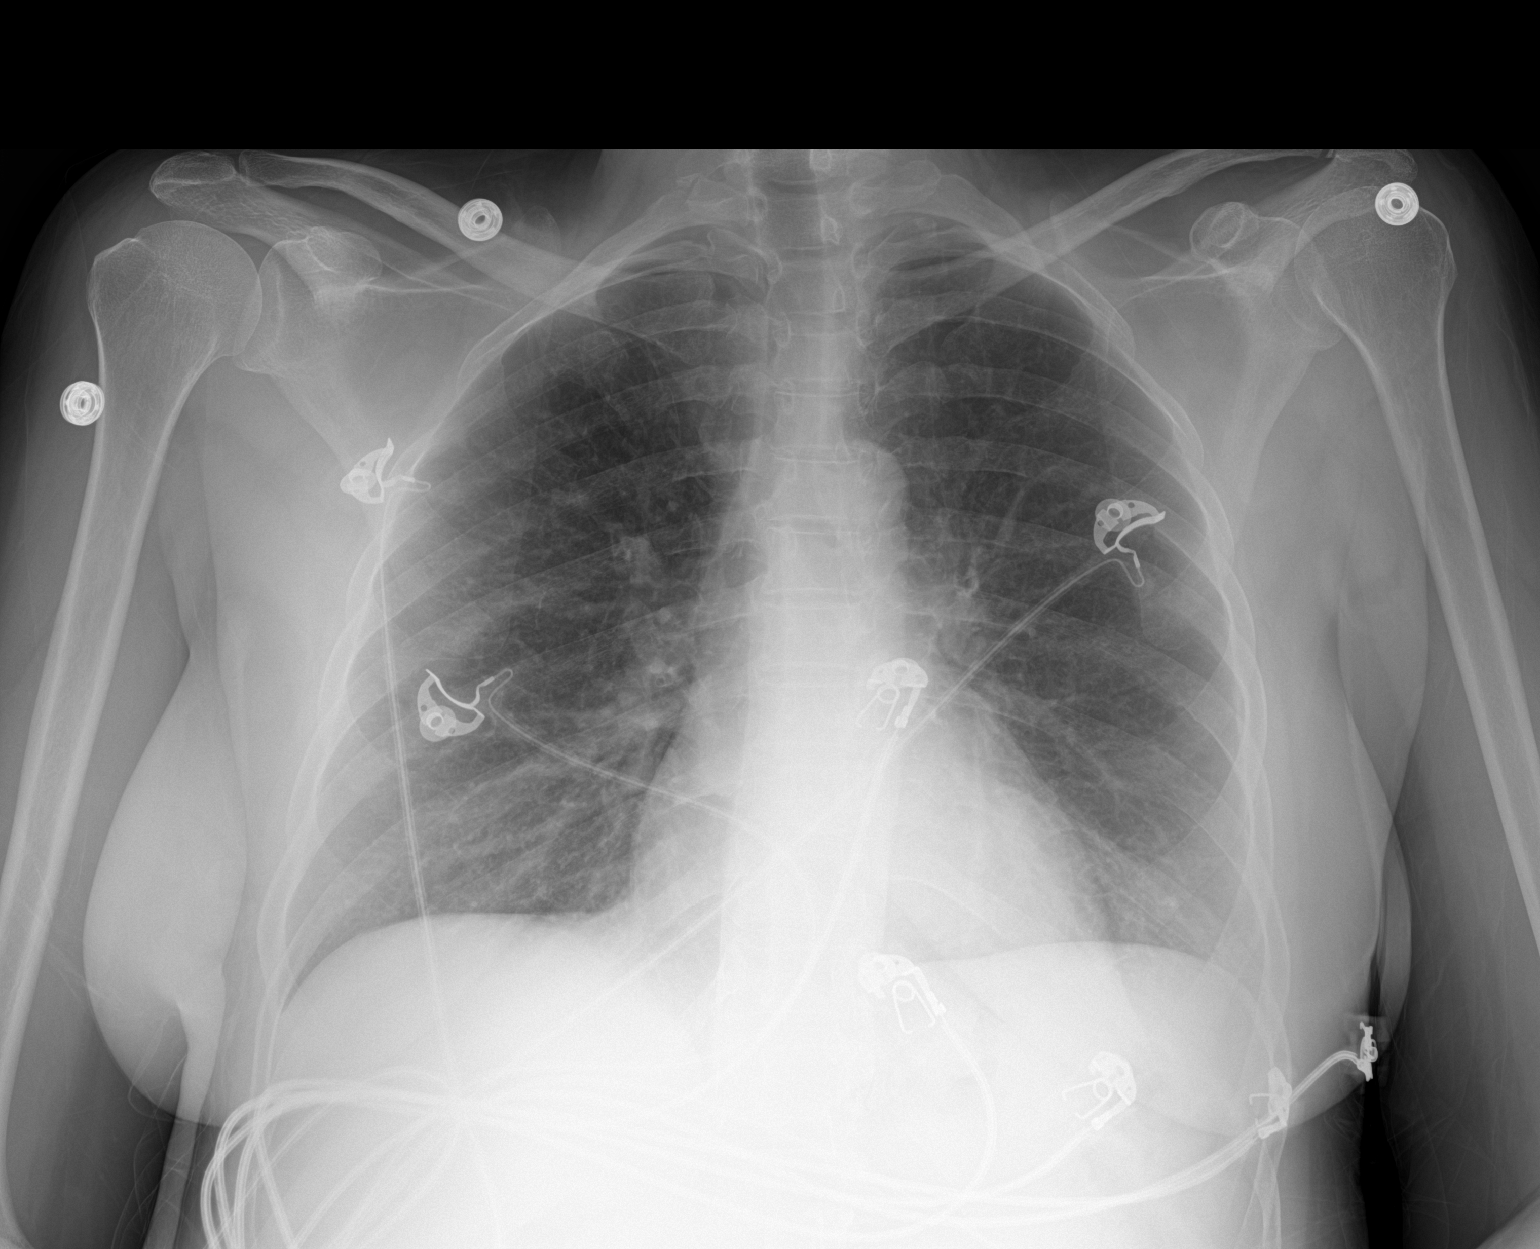

[1 of 1 positions shown; findings below may reference images not displayed]

FINDINGS: Heart and mediastinal contours are within normal limits. No focal
opacities or effusions. No acute bony abnormality.
IMPRESSION: No active disease.
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
